# Patient Record
Sex: Male | Born: 1935 | Race: White | Hispanic: No | Marital: Married | Smoking: Former smoker
Health system: Southern US, Community
[De-identification: ages and names within clinical notes are randomized; demographics above are authoritative.]

## PROBLEM LIST (undated history)

## (undated) DIAGNOSIS — M1990 Unspecified osteoarthritis, unspecified site: Secondary | ICD-10-CM

## (undated) DIAGNOSIS — N400 Enlarged prostate without lower urinary tract symptoms: Secondary | ICD-10-CM

## (undated) DIAGNOSIS — K219 Gastro-esophageal reflux disease without esophagitis: Secondary | ICD-10-CM

## (undated) DIAGNOSIS — C679 Malignant neoplasm of bladder, unspecified: Secondary | ICD-10-CM

## (undated) DIAGNOSIS — G4730 Sleep apnea, unspecified: Secondary | ICD-10-CM

## (undated) DIAGNOSIS — Z86718 Personal history of other venous thrombosis and embolism: Secondary | ICD-10-CM

## (undated) DIAGNOSIS — F419 Anxiety disorder, unspecified: Secondary | ICD-10-CM

## (undated) HISTORY — DX: Malignant neoplasm of bladder, unspecified: C67.9

## (undated) HISTORY — DX: Personal history of other venous thrombosis and embolism: Z86.718

---

## 2002-06-22 HISTORY — PX: PROSTATE SURGERY: SHX751

## 2003-04-27 ENCOUNTER — Observation Stay (HOSPITAL_COMMUNITY): Admission: RE | Admit: 2003-04-27 | Discharge: 2003-04-28 | Payer: Self-pay | Admitting: Urology

## 2005-08-27 ENCOUNTER — Ambulatory Visit (HOSPITAL_COMMUNITY): Admission: RE | Admit: 2005-08-27 | Discharge: 2005-08-27 | Payer: Self-pay | Admitting: Family Medicine

## 2005-09-28 ENCOUNTER — Encounter (INDEPENDENT_AMBULATORY_CARE_PROVIDER_SITE_OTHER): Payer: Self-pay | Admitting: *Deleted

## 2005-09-28 ENCOUNTER — Ambulatory Visit: Payer: Self-pay | Admitting: Internal Medicine

## 2005-09-28 ENCOUNTER — Ambulatory Visit (HOSPITAL_COMMUNITY): Admission: RE | Admit: 2005-09-28 | Discharge: 2005-09-28 | Payer: Self-pay | Admitting: Internal Medicine

## 2006-06-22 DIAGNOSIS — C679 Malignant neoplasm of bladder, unspecified: Secondary | ICD-10-CM

## 2006-06-22 DIAGNOSIS — Z86718 Personal history of other venous thrombosis and embolism: Secondary | ICD-10-CM

## 2006-06-22 HISTORY — DX: Malignant neoplasm of bladder, unspecified: C67.9

## 2006-06-22 HISTORY — PX: BLADDER SURGERY: SHX569

## 2006-06-22 HISTORY — DX: Personal history of other venous thrombosis and embolism: Z86.718

## 2006-10-21 ENCOUNTER — Ambulatory Visit (HOSPITAL_COMMUNITY): Admission: RE | Admit: 2006-10-21 | Discharge: 2006-10-21 | Payer: Self-pay | Admitting: Urology

## 2006-10-26 ENCOUNTER — Ambulatory Visit (HOSPITAL_COMMUNITY): Admission: RE | Admit: 2006-10-26 | Discharge: 2006-10-26 | Payer: Self-pay | Admitting: Urology

## 2006-11-05 ENCOUNTER — Encounter (INDEPENDENT_AMBULATORY_CARE_PROVIDER_SITE_OTHER): Payer: Self-pay | Admitting: Urology

## 2006-11-05 ENCOUNTER — Ambulatory Visit (HOSPITAL_COMMUNITY): Admission: RE | Admit: 2006-11-05 | Discharge: 2006-11-05 | Payer: Self-pay | Admitting: Urology

## 2006-11-17 ENCOUNTER — Encounter (INDEPENDENT_AMBULATORY_CARE_PROVIDER_SITE_OTHER): Payer: Self-pay | Admitting: Urology

## 2006-11-17 ENCOUNTER — Inpatient Hospital Stay (HOSPITAL_COMMUNITY): Admission: RE | Admit: 2006-11-17 | Discharge: 2006-11-20 | Payer: Self-pay | Admitting: Urology

## 2007-02-23 ENCOUNTER — Inpatient Hospital Stay (HOSPITAL_COMMUNITY): Admission: EM | Admit: 2007-02-23 | Discharge: 2007-03-01 | Payer: Self-pay | Admitting: Emergency Medicine

## 2007-02-23 ENCOUNTER — Ambulatory Visit: Payer: Self-pay | Admitting: Internal Medicine

## 2007-02-23 ENCOUNTER — Ambulatory Visit (HOSPITAL_COMMUNITY): Admission: RE | Admit: 2007-02-23 | Discharge: 2007-02-23 | Payer: Self-pay | Admitting: Family Medicine

## 2007-04-18 ENCOUNTER — Inpatient Hospital Stay (HOSPITAL_COMMUNITY): Admission: AD | Admit: 2007-04-18 | Discharge: 2007-04-20 | Payer: Self-pay | Admitting: Urology

## 2007-04-19 ENCOUNTER — Encounter (INDEPENDENT_AMBULATORY_CARE_PROVIDER_SITE_OTHER): Payer: Self-pay | Admitting: Urology

## 2007-12-13 ENCOUNTER — Ambulatory Visit (HOSPITAL_COMMUNITY): Admission: RE | Admit: 2007-12-13 | Discharge: 2007-12-13 | Payer: Self-pay | Admitting: Urology

## 2007-12-13 ENCOUNTER — Encounter (INDEPENDENT_AMBULATORY_CARE_PROVIDER_SITE_OTHER): Payer: Self-pay | Admitting: Urology

## 2010-05-06 ENCOUNTER — Ambulatory Visit (HOSPITAL_COMMUNITY): Admission: RE | Admit: 2010-05-06 | Discharge: 2010-05-06 | Payer: Self-pay | Admitting: Urology

## 2010-07-14 ENCOUNTER — Encounter: Payer: Self-pay | Admitting: Urology

## 2010-11-04 NOTE — Consult Note (Signed)
NAME:  [name], [name] NO.:  [id number]   MEDICAL RECORD NO.:  [id number]         PATIENT TYPE:  PAMB   LOCATION:  DAY                           FACILITY:  [facility]   PHYSICIAN:  Ky Barban, M.D.DATE OF BIRTH:  Jan 17, 1927   DATE OF CONSULTATION:  12/13/2007  DATE OF DISCHARGE:                                 CONSULTATION   CHIEF COMPLAINT:  Recurrent bladder tumor.  A 77 year old gentleman  known to have bladder carcinoma, high grade.  Last year, he underwent  partial cystectomy.  Follow-up cystoscopies have been negative.  Recently, he had a cystoscopy.  There is a small papillary growth  recurrence.  It looks like it is a superficial tumor.  He is coming in  as outpatient.  Will have a biopsy and fulguration of this area.  No  history of fever, chills or gross hematuria.   PAST HISTORY:  1. 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  2. In the year 2000 underwent holmium laser ablation of the prostate      for BPH and also history of passing kidney stones about 25 years      ago.  No history of diabetes or hypertension.   FAMILY HISTORY:  History of kidney stones in the family.  No history of  prostate cancer.   PERSONAL HISTORY:  Does not smoke or drink.   REVIEW OF SYSTEMS:  Denies any chest pain, orthopnea, PND, nausea,  vomiting, fever, chills.   On examination, blood pressure 140/80, temperature is normal.  CENTRAL NERVOUS SYSTEM:  No gross neurological deficit.  HEENT:  Negative.  CHEST:  Symmetrical.  HEART:  Regular sinus rhythm, no murmur.  ABDOMEN:  Soft, flat.  Liver, spleen, kidneys are not palpable.  No CVA  tenderness.  EXTERNAL GENITALIA:  Circumcised, meatus adequate.  Testicles are  normal.  RECTAL EXAM:  Was deferred.  EXTREMITIES:  Were normal.   IMPRESSION:  Recurrent bladder tumor.   PLAN:  Cysto fulguration and resection of bladder tumor under  anesthesia  as outpatient.     Ky Barban, M.D.  Electronically Signed    MIJ/MEDQ  D:  12/12/2007  T:  12/12/2007  Job:  [id number]

## 2010-11-04 NOTE — Op Note (Signed)
NAME:  KISHAWN, PICKAR NO.:  [id number]   MEDICAL RECORD NO.:  [id number]          PATIENT TYPE:  INP   LOCATION:  A311                          FACILITY:  [facility]   PHYSICIAN:  Ky Barban, M.D.DATE OF BIRTH:  1926-10-11   DATE OF PROCEDURE:  04/19/2007  DATE OF DISCHARGE:                               OPERATIVE REPORT   PREOPERATIVE DIAGNOSIS:  Bladder tumor.   POSTOPERATIVE DIAGNOSIS:  Bladder tumor.   PROCEDURE:  Transurethral resection of bladder tumor.   ANESTHESIA:  Spinal anesthesia.   SURGEON:  Ky Barban, M.D.   DESCRIPTION OF PROCEDURE:  Under spinal anesthesia in the lithotomy  position, he was prepped and draped.  A #28 Iglesias resectoscope was  introduced into the bladder.  The tumor was located in the junction of  the right bladder wall and the anterior bladder wall.  It looks like  superficial papillary growth.  With the resectoscope I simply resected  the tumor and then using a holmium laser at the following settings,  using pulse energy 1.2 joules, 20 pulses, power 24 watts, total  kilojoules used 5.77, and the areas surrounding the tumor resected area  was treated with laser also at the base.  Grossly I do not see any more  tumor.  There is no bleeding.  The scope is removed and left in a Foley  catheter for drainage.   The patient left the operating room in satisfactory condition.      Ky Barban, M.D.  Electronically Signed     MIJ/MEDQ  D:  04/19/2007  T:  04/19/2007  Job:  [id number]

## 2010-11-04 NOTE — Op Note (Signed)
NAME:  [name], [name] NO.:  [id number]   MEDICAL RECORD NO.:  [id number]          PATIENT TYPE:  AMB   LOCATION:  DAY                            FACILITY:   PHYSICIAN:  Ky Barban, M.D.DATE OF BIRTH:  1927/01/16   DATE OF PROCEDURE:  11/05/2006  DATE OF DISCHARGE:                               OPERATIVE REPORT   PREOPERATIVE DIAGNOSIS:  Gross hematuria.   POSTOPERATIVE DIAGNOSIS:  Bladder tumor.   PROCEDURE:  1. Cystoscopy.  2. Bladder tumor biopsy.  3. Multiple bladder biopsies.  4. Fulguration of the bleeders.   SURGEON:  Ky Barban, MD.   ANESTHESIA:  General.   DESCRIPTION OF PROCEDURE:  The patient under spinal anesthesia after a  general prep and drape, a #25 cystoscope introduced into the bladder,  and with the help of the right angle lens I can see there is a solid,  ulcerated tumor located on the bladder wall at the junction of the left  bladder wall and the dome of the bladder wall.  The rest of the bladder  grossly looks normal.  Then using a rigid biopsy forceps then a flexible  biopsy forceps, some of the biopsies from the tumor are taken, and the  site is fulgurated with a [employer] electrode.  Next, I took some  biopsies at random from the right and left bladder wall, also behind the  trigone and the posterior bladder wall.  These biopsy sites are  fulgurated.  At the end, there is no bleeding. The cystoscope was then  removed, and the patient left the operating room in satisfactory  condition.      Ky Barban, M.D.  Electronically Signed     MIJ/MEDQ  D:  11/05/2006  T:  11/05/2006  Job:  [id number]

## 2010-11-04 NOTE — Discharge Summary (Signed)
NAME:  [name], [name] NO.:  [id number]   MEDICAL RECORD NO.:  [id number]          PATIENT TYPE:  INP   LOCATION:  A328                          FACILITY:  [facility]   PHYSICIAN:  Dorris Singh, DO    DATE OF BIRTH:  January 10, 1927   DATE OF ADMISSION:  02/23/2007  DATE OF DISCHARGE:  09/09/2008LH                               DISCHARGE SUMMARY   ADMISSION DIAGNOSES:  1. Deep vein thrombosis.  2. History of bladder cancer.  3. Benign prostatic hypertrophy.   DISCHARGE DIAGNOSES:  1. Deep vein thrombosis.  2. Leukocytosis.  3. History of bladder cancer.  4. Benign prostatic hypertrophy.   PRIMARY CARE PHYSICIAN:  Dr. Regino Schultze.   CONSULTS:  Dr. Jerre Simon, Physical Therapy.   Tests that were done on the patient while he was here include a venous  ultrasound of his lower extremity which demonstrated acute deep venous  thrombosis in the distal left superficial femoral vein.  A chest x-ray  that was done on September 3rd, CT angiogram showed negative for  negative for pulmonary embolus or acute pulmonary disease, emphysema,  and a remote T12 compression fracture.  And a chest x-ray on September  8th demonstrated no acute abnormalities, changes of COPD and acute  bronchitis.  The patient also had an echocardiogram done with an  ejection fraction of 55.  No pericardial effusion seen.  Normal LVEF and  no wall motion abnormalities.  RV systolic function is also normal.  His  H&P, please refer to that.   HOSPITAL COURSE:  The patient was admitted to the service of [facility]  and was started on Lovenox and Coumadin.  At this point in time, while  he was here, he was started on Lovenox b.i.d. and started on Coumadin  and seen by Physical Therapy to help improve his unstable gait as the  patient said he was weak.  On September 5th, his INR was still not in  range.  He was still continued on the Lovenox and Coumadin then, on  September 7th, he had critical values of an INR of  5.2 and all of his  medications were held.  Pharmacy to dose Coumadin on September 8th and  his INR was 4.4 and his Coumadin, at that point in time, was held.  His  INR became supratherapeutic.  Also the patient started to have a  leukocytosis that was more elevated.  He was currently on antibiotics  and his cultures were negative.  We continued to have him work with  Physical Therapy and got a chest x-ray on September 8th.  Also, the  patient wanted to be seen by Dr. Jerre Simon since he has a history of  bladder cancer and had an appointment with him.  Dr. Jerre Simon came and saw  him.  Once the patient's INR is therapeutic, will think about doing a  cystoscopy.  On September 9th, the patient wanted to go home.  Discussed  with Case Management if that was possible based on his current INR.  Home health nurse could come and check his INRs daily and hold his  Coumadin or  start his Coumadin based on his PCP's recommendations and go  ahead and have PT come see patient.  Also will have patient schedule  cystoscopy with Dr. Jerre Simon on an outpatient basis at this point in time.   DISPOSITION:  Is stable.   SPECIAL INSTRUCTIONS:  The patient will be discharged with home health  care with a nurse to check his INR daily as well as Physical Therapy to  come see him and work with strengthening his lower extremities.  Also,  the patient is to see Dr. Regino Schultze in 1-7 days for follow-up   MEDICATIONS:  That patient will be sent home on include:  1. Flomax 0.4 mg.  2. Fexofenadine 180 mg.  3. MiraLax.  4. Tylenol.  5. Xanax.  6. And also Levaquin 750 mg p.o. daily times 10 days.      Dorris Singh, DO  Electronically Signed     CB/MEDQ  D:  03/01/2007  T:  03/02/2007  Job:  [id number]   cc:   Kirk Ruths, M.D.  Fax: [phone]

## 2010-11-04 NOTE — Op Note (Signed)
NAME:  SAADIQ, POCHE NO.:  [id number]   MEDICAL RECORD NO.:  [id number]          PATIENT TYPE:  INP   LOCATION:  A227                          FACILITY:  [facility]   PHYSICIAN:  Ky Barban, M.D.DATE OF BIRTH:  10-26-1926   DATE OF PROCEDURE:  11/17/2006  DATE OF DISCHARGE:                               OPERATIVE REPORT   PREOPERATIVE DIAGNOSIS:  Bladder tumor, high grade infiltrating.   POSTOPERATIVE DIAGNOSIS:  Bladder tumor, high grade infiltrating.   PROCEDURE:  Partial cystectomy and left pelvic node dissection which  includes obturator and external iliacs.   ANESTHESIA:  General.   ESTIMATED BLOOD LOSS:  100 mL.   SURGEON:  Ky Barban, M.D.   COMPLICATIONS:  None.   COUNTS:  Instrument, needle, and sponge counts were correct.   DESCRIPTION OF PROCEDURE:  The patient is given general endotracheal  anesthesia, placed in supine position, sterile prep and drape.  A #20  Foley catheter was inserted into the bladder and suprapubic midline  incision about 2 1/2 inches long made and carried down through  subcutaneous tissue.  The rectus sheath was incised in the line of the  incision and rectus separated in the midline and retropubic space was  exposed.  The left side of the bladder was dissected away from the  pelvic wall exposing the left common iliac vessels.  A self-retaining  retractor was applied and I proceeded to the node dissection first.  I  palpated the bladder, the bladder tumor was palpated near the dome on  the left bladder wall very easily. It is the size of a large walnut.  I  proceeded to do the node dissection.  The fascia around the left  external iliac vein is opened.  The fat covering this was separated from  the psoas fascia, clipping the lymphatics, going towards the bifurcation  of the common iliac vein.  The obturator nerve and vessels were exposed  and they were identified.  I did not skeletonize those vessels.   There  was an area of fatty tissue in this triangle inbetween the bifurcation  of the left common iliac vein.  They were simply doubly clipped and  divided and the specimen was collected as obturator and left external  iliac artery lymph nodes.  This area was left packed and I proceeded to  do the partial cystectomy.   The bladder was filled up through the Foley catheter. 2-0 Vicryl, two  stitches, were placed in the anterior surface of the bladder and the  anterior surface of the bladder was cleaned.  The peritoneum was pushed  superiorly and a stab wound is made between these two hemostatic  stitches.  The bladder was opened and once the bladder was opened, the  tumor can be easily seen in the left bladder wall at the junction with  the dome. So I, under direct vision, proceeded to circumscribe the tumor  leaving a good margin of healthy bladder around it.  During this  procedure, the urachus was also divided and ligated.  The peritoneum was  opened up near the urachus  and, under direct vision with the pelvic  peritoneum covering that part of the bladder which has tumor and also  the pelvic fat which is covering that part of the bladder, was encircled  and with the help of the artery and applying Allis clamps to the bladder  margins, the entire tumor was nicely removed with good clean surgical  margins around it.   The peritoneum was closed with 3-0 Vicryl continuous stitch. The bladder  was closed in three layers, the first layer was a continuous stitch of 3-  0 Vicryl closing the mucosa.  A second layer was a continuous layer  closing the bladder muscle and the third layer was interrupted sutures  of 3-0 Vicryl placed to cover the suture line.  The bladder was filled  up.  There was no leak.  The suture line of the bladder was drained with  Jackson-Pratt drain which came out through a separate stab wound on the  left side and it was stabilized to the skin with a 0 silk stitch. The   operative site was irrigated with normal saline and I proceeded to close  the incision.  The rectus fascia was closed with continuous stitch of 0  Vicryl.  The skin was closed with staples.  A sterile gauze dressing was  applied.  The patient lost about 100 mL of blood and there were no  complications.  All needle, sponge and instrument count was correct.  The patient left the operating room in satisfactory condition.      Ky Barban, M.D.  Electronically Signed     MIJ/MEDQ  D:  11/17/2006  T:  11/17/2006  Job:  [id number]

## 2010-11-04 NOTE — Group Therapy Note (Signed)
NAME:  [name], KOBASHIGAWA NO.:  [id number]   MEDICAL RECORD NO.:  [id number]          PATIENT TYPE:  INP   LOCATION:  A328                          FACILITY:  [facility]   PHYSICIAN:  Dorris Singh, DO    DATE OF BIRTH:  11/19/1926   DATE OF PROCEDURE:  02/28/2007  DATE OF DISCHARGE:                                 PROGRESS NOTE   HISTORY OF PRESENT ILLNESS:  Patient seen today with wife asking many  questions regarding care and swelling in legs. Talked to patient  regarding some of the possibilities.  They are concerned currently now  of congestive heart failure after speaking to relatives and friends that  as both of his legs and the pain that he has.  I also explained to them  the course of his DVT and why he has swelling at least in his left leg.  The patient has not had any diagnosis of any heart disease per their  recount of his past medical history.  Also, they were supposed to see  Dr. Jerre Simon for his bladder cancer that he had surgery on in May 2008,  and there is a concern that he would like to see him while he is here.  We will go ahead and consult Dr. Jerre Simon.   PHYSICAL EXAMINATION:  VITAL SIGNS:  Temperature 98.4 , pulse 74,  respirations 20 and blood pressure is 119/66.  GENERAL:  This is a 77 year old Caucasian male who is well-developed,  well-nourished, in no acute distress.  HEART:  Regular rate and rhythm.  No gallops, rubs or murmurs noted.  LUNGS:  Clear to auscultation bilaterally.  No wheezes, rales or  rhonchi.  ABDOMEN:  Soft, nontender, nondistended.  EXTREMITIES:  Bilateral lower extremities have +2 pitting edema.  Per  the patient, they are swollen.   LABORATORY DATA:  His labs are as follows today.  His white count is  18.0, hemoglobin is 12.6, hematocrit of 30.0 and platelet count is 381.  His percentage neutrophils is 83%.  Chemistry:  Sodium is 136, potassium  4.0, chloride 102, CO2 is 26, glucose is 103 BUN is 15 and creatinine is  0.75.  Blood cultures have not come back yet, and his urine is negative.  INR is 4.4 today.   ASSESSMENT AND PLAN:  1. Left lower extremity deep vein thrombosis.  The patient is still      currently on Coumadin.  His INR is still elevated.  Also, history      of bladder cancer.  He is being followed as an outpatient.  The      patient would like to see Dr. Jerre Simon.  We will go ahead and consult      him.  2. Leukocytosis.  The patient is still on antibiotics.  Their  are no      blood cultures that have come in yet.  Will Need to get a chest x-      ray today.  Will do that.  Also, due to the patient's leg      ambulation, will have physical therapy come see him,  and have      written for the case management to come see him as well.  I will      also order an incentive spirometry and a 2-D echo to rule out a      chest x-ray to rule out any CHF.     Dorris Singh, DO  Electronically Signed    CB/MEDQ  D:  02/28/2007  T:  02/28/2007  Job:  [id number]

## 2010-11-04 NOTE — Consult Note (Signed)
NAME:  [name], [name] NO.:  [id number]   MEDICAL RECORD NO.:  [id number]          PATIENT TYPE:  INP   LOCATION:  A311                          FACILITY:  [facility]   PHYSICIAN:  Marcello Moores, MD   DATE OF BIRTH:  February 04, 1927   DATE OF CONSULTATION:  04/18/2007  DATE OF DISCHARGE:                                 CONSULTATION   PMD:  Dr. Sherilyn Cooter.   UROLOGIST:  Ky Barban, M.D.   REASON FOR CONSULTATION:  For DVT management and clearance for surgery.   HISTORY OF PRESENT ILLNESS:  Mr. Helfand is a 77 year old man with  history of DVT, who was diagnosed in September 2008 and was on Coumadin  for the last one month and 3 weeks, and it was stopped 5 days ago for  the plan of tomorrow's surgery for bladder cancer tumor removal and  called consultation for clearance as well as for management of the DVT  while he was in the hospital.  The patient had no history of DVT before  but last month he presented to his PMD with left leg swelling and  ultrasound Doppler of the lower extremity was done and DVT was found and  he was treated with Lovenox and discharged with Coumadin and now there  is a plan by Dr. Jerre Simon for surgery on his bladder cancer.  He stopped 5  days ago for the surgery and INR today is 1.2.__________ Otherwise, the  patient has no bleeding, no other complaints, and he is urinating well.   REVIEW OF SYSTEMS:  A 10-point review of systems is noncontributory for  this admission.   ALLERGIES:  He is allergic to CEPHALOSPORINS, PENICILLINS and SULFA  DRUGS.   SOCIAL HISTORY:  He lives with his wife at [place] and denies smoking  and any drug or alcohol abuse.   PAST MEDICAL HISTORY:  1. He has bladder CA, status post partial cystectomy in May 2008.  2. He has benign prostatic hyperplasia.  3. DVT on the left lower leg since September 2008.   HOME MEDICATIONS:  1. Flomax 0.4 mg.  2. Xanax p.r.n.  3. He was on Coumadin 5 mg daily until  last Friday, 5 days ago.  4. MiraLax.   PHYSICAL EXAMINATION:  The patient is lying in bed without any distress.  VITAL SIGNS:  Temperature 98, pulse 59, respiratory rate 20, blood  pressure 117/71.  HEENT:  He has pink conjunctivae, nonicteric sclerae.  NECK:  Supple.  CHEST:  Good air entry bilateral.  CARDIOVASCULAR:  S1-S2 regular, no murmur.  ABDOMEN:  Soft and no area of tenderness.  Normoactive bowel sounds.  EXTREMITIES:  No pedal edema.  CNS:  He is alert and well-oriented.   LABS:  White blood cell 5.7, hemoglobin 11, hematocrit 33.8, platelet  count is 279.  PT 15, INR is 1.2.  Sodium is 141, potassium 4.2,  chloride 107, bicarb 27, glucose is 91, BUN 9, creatinine is 0.7.  Urinalysis is negative including for any sign of blood.   ASSESSMENT:  Deep vein thrombosis.  He was on Coumadin for the  last  about 6 weeks and he was stopped for the sake of surgery, and we will  put him on today on heparin drip and will stop it early in the morning  tomorrow to restart it after surgery and then will change it to  Coumadin, to be discharged with Coumadin, and will watch closely for any  sign of bleeding.  This was discussed with the patient as well as with  Dr. Jerre Simon.  Will continue other medications and he will have his  bladder carcinoma removal tomorrow morning by Dr. Jerre Simon.  Otherwise,  the patient is fairly stable, and will follow him with Dr. Jerre Simon.  This  was discussed, the plan, with the patient and he agreed.      Marcello Moores, MD  Electronically Signed     MT/MEDQ  D:  04/18/2007  T:  04/19/2007  Job:  [id number]

## 2010-11-04 NOTE — H&P (Signed)
NAME:  [name], [name] NO.:  [id number]   MEDICAL RECORD NO.:  [id number]          PATIENT TYPE:  AMB   LOCATION:  DAY                           FACILITY:  [facility]   PHYSICIAN:  Ky Barban, M.D.DATE OF BIRTH:  18-Jun-1927   DATE OF ADMISSION:  11/17/2006  DATE OF DISCHARGE:  LH                              HISTORY & PHYSICAL   ADDENDUM TO HISTORY AND PHYSICAL:  This patient is coming tomorrow to  have partial cystectomy done at [facility].  He recently had  surgery, so he already has History and Physical.  There is no change in  his History and Physical.   The tumor of the bladder was resected, and it showed that he has high  grade urothelial carcinoma and multiple biopsies from remaining bladder  that were negative.  I am going to remove the section of the bladder  that contained high-grade urothelial carcinoma.  It does not show that  it is invasion of the muscle, but CT scan and ultrasound does show  actual physical changes in the bladder at that site which makes me feel  like he has invasion.  I have discussed this with the patient.  He  understands and wants me to go ahead and proceed.  I will also do lymph  node dissection on the left side in the pelvis.      Ky Barban, M.D.  Electronically Signed     MIJ/MEDQ  D:  11/16/2006  T:  11/16/2006  Job:  [id number]

## 2010-11-04 NOTE — H&P (Signed)
NAME:  [name], [name] NO.:  [id number]   MEDICAL RECORD NO.:  [id number]          PATIENT TYPE:  AMB   LOCATION:  DAY                           FACILITY:  [facility]   PHYSICIAN:  Ky Barban, M.D.DATE OF BIRTH:  1926/10/29   DATE OF ADMISSION:  DATE OF DISCHARGE:  LH                              HISTORY & PHYSICAL   CHIEF COMPLAINT:  Gross total painless hematuria, possible bladder  tumor.   HISTORY:  A 77 year old gentleman is having gross total painless  hematuria for several days, almost 3 weeks.  He has undergone complete  workup.  CT of the abdomen and pelvis is done.  It should that there is  a questionable bladder tumor consistent with cancer.  His urine  cytologies are negative and pelvic ultrasound also shows there is  suspicion of lesion in the bladder but I have done cystoscopy in the  office.  I could not see any tumor in the bladder, so I am going to  bring him in the hospital as an outpatient and under do anesthesia will  do biopsies and multiple bladder biopsies.  Also, there is a little bit  of fullness on cystoscopy of the left intermural ureter.  I am going to  do a retrograde on that side also.  I explained this to the patient.  He  understands and wanted me to go ahead and schedule.   PAST MEDICAL HISTORY:  1. In 2000 he underwent holmium laser ablation of the prostate for      BPH.  2. He passed a kidney stone 25 years ago.   No history of diabetes or hypertension.   It should be also added that in year 2000 he was diagnosed elevated PSA  and we did a prostate biopsy.  It was negative, but his PSA has risen  above the normal range ever since then.   FAMILY HISTORY:  History of kidney stones.   PERSONAL HISTORY:  Does not smoke or drink.   REVIEW OF SYSTEMS:  Denies any chest pain, orthopnea, PND, nausea,  vomiting.   EXAMINATION:  VITAL SIGNS:  Blood pressure 138/78, temperature is 97.  CENTRAL NERVOUS SYSTEM:  Negative.  HEENT:  Negative.  CHEST:  Symmetrical.  HEART:  Regular sinus rhythm, no murmur.  ABDOMEN:  Soft, flat.  Liver, spleen, kidneys are not palpable.  No  severe tenderness.  GENITOURINARY:  External genitalia is circumcised and testicles are  normal.  RECTAL:  Prostate 1.5+, smooth and firm.   IMPRESSION:  Gross hematuria, possible bladder tumor.   PLAN:  Cystoscopy, multiple bladder biopsies, left retrograde pyelogram  under anesthesia as outpatient.      Ky Barban, M.D.  Electronically Signed     MIJ/MEDQ  D:  11/04/2006  T:  11/04/2006  Job:  [id number]

## 2010-11-04 NOTE — Procedures (Signed)
NAME:  [name], [name] NO.:  [id number]   MEDICAL RECORD NO.:  [id number]          PATIENT TYPE:  INP   LOCATION:  A328                          FACILITY:  [facility]   PHYSICIAN:  Pricilla Riffle, MD, FACCDATE OF BIRTH:  12-22-1926   DATE OF PROCEDURE:  02/28/2007  DATE OF DISCHARGE:                                ECHOCARDIOGRAM   INDICATIONS:  The patient is a 77 year old with a history of swelling  and bladder cancer.  Test to evaluate LV function.   2-D echo with echo Doppler.  Left ventricle was normal in size with an  end-diastolic dimension of 44 mm.  The interventricular septum and  posterior wall are mildly thickened at 14 and 13 mm each.   The left atrium is grossly normal.  The right atrium is grossly normal.  The right ventricle is normal.   The aortic valve is mildly thickened.  There is no stenosis.  No  insufficiency.  The mitral valve is mildly thickened with mild annular  calcification.  There is no insufficiency.  The pulmonic valve is  grossly normal.  The tricuspid valve is normal with no insufficiency.   Overall LV systolic function is normal with an LVEF of approximately  55%.  There are no definite wall motion abnormalities.  RV systolic  function is also grossly normal.   Note, there is mild LV diastolic dysfunction noted.   No pericardial effusion is seen.      Pricilla Riffle, MD, [facility]  Electronically Signed     PVR/MEDQ  D:  02/28/2007  T:  03/01/2007  Job:  [id number]   cc:   Dorris Singh, DO

## 2010-11-04 NOTE — Discharge Summary (Signed)
NAME:  [name], [name] NO.:  [id number]   MEDICAL RECORD NO.:  [id number]          PATIENT TYPE:  INP   LOCATION:  A328                          FACILITY:  [facility]   PHYSICIAN:  Dorris Singh, DO    DATE OF BIRTH:  [birth year]   DATE OF ADMISSION:  02/23/2007  DATE OF DISCHARGE:  09/09/2008LH                               DISCHARGE SUMMARY   ADDENDUM   Coumadin 5 mg, take as directed, only to be taken once primary care  physician gives patient instructions to do so.      Dorris Singh, DO  Electronically Signed     CB/MEDQ  D:  03/01/2007  T:  03/02/2007  Job:  [id number]

## 2010-11-04 NOTE — Group Therapy Note (Signed)
NAME:  [name], [name] NO.:  [id number]   MEDICAL RECORD NO.:  [id number]          PATIENT TYPE:  INP   LOCATION:  A328                          FACILITY:  [facility]   PHYSICIAN:  Skeet Latch, DO    DATE OF BIRTH:  1926/09/26   DATE OF PROCEDURE:  DATE OF DISCHARGE:                                 PROGRESS NOTE   SUBJECTIVE:  The patient still seems to be ambulating.  The patient  still complains of leg weakness and pain upon ambulation.  The patient  has no other chest pain, abdominal or urinary complaints today.  The  patient's wife is present and at his bedside on exam.  The patient has  not had a bowel movement since yesterday.  Overall, patient's condition  shows no significant change.   OBJECTIVE:  VITAL SIGNS:  Temperature is 99.8, pulse 81, respirations  20, blood pressure 126/64.  The patient is sating 92% on room air.  CARDIOVASCULAR:  Regular rate and rhythm.  No murmurs, gallops or rubs.  RESPIRATORY:  Lungs are clear to auscultation.  No rales, rhonchi or  wheezing.  ABDOMEN:  Soft, nontender, nondistended.  Positive bowel sounds.  EXTREMITIES:  Edema is still improving in the left ankle.  No pain on  palpation with no erythema.   LABORATORY DATA:  Blood cultures are still negative at this time.  Urine  culture showed multiple species.  Recollection is indicated at this  time.  PT 50.9, INR 5.2, white count 16.1, hemoglobin 12.6, hematocrit  37.7, platelets 375.   ASSESSMENT AND PLAN:  1. Left lower extremity deep venous thrombosis.  The patient is on      Lovenox and Coumadin.  His INR is elevated.  Pharmacy is following      his levels, and his Coumadin is on hold at this time.  2. The patient has a history of bladder cancer, partial cystectomy.      This is being followed as an outpatient.  3. Leukocytosis.  Still unknown etiology.  Is more elevated than      yesterday.  The patient is on antibiotics.  Blood cultures are      negative so far.   We will get a chest x-ray to rule out any      pulmonary disease at this time.  4. The patient's ambulation and leg pain continues.  We will get      physical therapy to see patient in the morning, and consult case      management regarding possible stent with a short-term rehab      facility versus home physical therapy.  The patient probably still      needs to be sent home on antibiotics, even if his chest x-ray is      negative.      Skeet Latch, DO  Electronically Signed     SM/MEDQ  D:  02/27/2007  T:  02/28/2007  Job:  [id number]

## 2010-11-04 NOTE — H&P (Signed)
NAME:  Nicholas, Holder NO.:  [id number]   MEDICAL RECORD NO.:  [id number]          PATIENT TYPE:  INP   LOCATION:  A311                          FACILITY:  [facility]   PHYSICIAN:  Ky Barban, M.D.DATE OF BIRTH:  [birth year]   DATE OF ADMISSION:  04/18/2007  DATE OF DISCHARGE:  LH                              HISTORY & PHYSICAL   CHIEF COMPLAINT:  Recurrent bladder tumor.   HISTORY:  A 77 year old gentleman who 6 months ago underwent partial  cystectomy for carcinoma of the bladder.  He had a high-grade tumor and  before doing partial cystectomy I had done multiple bladder biopsies.  His bladder was negative for any other tumor site and recently a routine  cystoscopy was done.  He was found to have a recurrent tumor on the  right bladder wall.  He had the original tumor on the left side but  there was no recurrence in that area, so I have advised him to undergo  TUR bladder tumor, for which he is being admitted in the hospital.  The  reason we had to admit him is because he is on Coumadin.  Since his  surgery he has developed DVT and has been on Coumadin and after  discussing with medical consult, I decided to bring him in.  We have  discontinued his Coumadin 3 days ago.  Today we are rechecking his pro  time.  Preoperatively we are going to put him on a heparin drip so that  we can do his surgery tomorrow.  The patient is well aware of that.   PAST MEDICAL HISTORY:  1. He had undergone holmium laser ablation of prostate for BPH in      2000.  2. He passed a kidney stone 25 years ago.  3. Partial cystectomy as I mentioned above for bladder cancer.   No history of diabetes or hypertension.   FAMILY HISTORY:  History of kidney stones in the family.   PERSONAL HISTORY:  Does not smoke or drink.   REVIEW OF SYSTEMS:  No orthopnea, PND, nausea, vomiting, fever, chills  or any voiding difficulty.  No chest pain, shortness of breath.   On examination,  moderately-built.  Blood pressure 140/80, temperature is normal.  CENTRAL NERVOUS SYSTEM:  No gross neurological deficit.  HEAD, NECK, EYE, ENT:  Negative.  CHEST:  Symmetrical.  HEART:  Regular sinus rhythm.  No murmur.  ABDOMEN:  Soft, flat.  Liver, spleen, kidneys are not felt.  There was  no CVA tenderness.  EXTERNAL GENITALIA:  Unremarkable.  RECTAL:  Deferred.  EXTREMITIES:  Normal.   IMPRESSION:  1. Recurrent bladder tumor.  2. Deep vein thrombosis and is on Coumadin.   PLAN:  Coumadin has been discontinued.  Today I will be checking his pro  time.  With consultation with the hospitalist, he will be placed on  heparin drip and be discontinued before his surgery.  Then I will  proceed with the TUR bladder tumor.  I am going to do a biopsy of this  tumor, resect it carefully, and probably use the holmium laser  to  cauterize the tumor.      Ky Barban, M.D.  Electronically Signed     MIJ/MEDQ  D:  04/18/2007  T:  04/19/2007  Job:  [id number]   cc:   Kirk Ruths, M.D.  Fax: [phone]

## 2010-11-04 NOTE — Group Therapy Note (Signed)
NAME:  [name], [name] NO.:  [id number]   MEDICAL RECORD NO.:  [id number]          PATIENT TYPE:  INP   LOCATION:  A328                          FACILITY:  [facility]   PHYSICIAN:  Skeet Latch, DO    DATE OF BIRTH:  [birth year]   DATE OF PROCEDURE:  02/26/2007  DATE OF DISCHARGE:                                 PROGRESS NOTE   The patient seems to be ambulating more today.  The wife is present  during exam and states that the patient went to the restroom and, after  he got out of the restroom, his legs felt weak, and the patient could  not get back to his bed.  The patient still has decreased ankle swelling  and pain in his lower left extremity.  The patient is now complaining of  some almost hip-type discomfort with movement of his legs.  The patient  is starting to have bowel movements pretty regularly also.  Overall, the  patient is stable and will continue to encourage ambulation.   OBJECTIVE:  VITAL SIGNS:  Temperature is 98.9, pulse 81, respirations  22, blood pressure 117/70.  The patient is satting 93% on room air.  CARDIOVASCULAR:  Regular rate and rhythm without murmurs, gallops, or  rubs.  RESPIRATORY:  Clear to auscultation bilaterally.  No rhonchi, rales, or  wheezing.  ABDOMEN:  Soft and nontender, nondistended.  Positive bowel sounds.  EXTREMITIES:  Still has some slight left ankle edema, minimal pain.  No  erythema to his calf or his ankle area.   LABS:  PT today is 46.4, INR 4.6.  White count is 13.6, hemoglobin 12.6,  hematocrit 37.8, platelet count is 401,000.   ASSESSMENT AND PLAN:  1. Left lower extremity deep vein thrombosis.  The patient continues      to be on Lovenox and Coumadin.  Will hold until next dose.  Will      have pharmacy begin doses of Coumadin.  2. History of bladder cancer and partial cystectomy.  This will be      followed as an outpatient.  3. Leukocytosis, unknown etiology.  Seems to be improving, but      continues  p.o. antibiotics.  So far, his blood cultures are      negative.  We will recheck white count in the a.m.  4. I anticipate the patient being discharged in the next 1 to 2 days      if the patient's ambulation improves.  Physical therapy was working      with the patient.  They will not be available until Monday morning.      I spoke with nursing and they will encourage his ambulation, and      help the patient ambulate.  If the patient continues to have      stagnant ambulation, the patient may need home physical therapy or      a short stay in short-term rehab facility.      Skeet Latch, DO  Electronically Signed     SM/MEDQ  D:  02/26/2007  T:  02/26/2007  Job:  [id number] 

## 2010-11-04 NOTE — Op Note (Signed)
NAME:  HAL, NORRINGTON NO.:  [id number]   MEDICAL RECORD NO.:  [id number]          PATIENT TYPE:  AMB   LOCATION:  DAY                           FACILITY:  [facility]   PHYSICIAN:  Ky Barban, M.D.DATE OF BIRTH:  1926-09-13   DATE OF PROCEDURE:  DATE OF DISCHARGE:                               OPERATIVE REPORT   PREOP DIAGNOSIS:  Bladder tumor.   POSTOP DIAGNOSIS:  Bladder tumor.   PROCEDURE:  Bladder tumor biopsy and fulguration.   ANESTHESIA:  General.   PROCEDURE:  The patient was given general endotracheal anesthesia,  placed in lithotomy position, area prepped and draped.  A #25 cystoscope  introduced into the bladder.  The tumor was located behind the bladder  neck slightly to the right of the midline on the anterior bladder wall.  Tumor was brought in focus and the flexible forceps biopsy of the tumor  was done.  Then using the [facility] resectoscope and I used a roller  electrode, the tumor was completely fulgurated.  The rest of the bladder  looks grossly normal.  The resectoscope was removed.  A #20 Foley  catheter left in for drainage.  The patient left the operating room in  satisfactory condition.      Ky Barban, M.D.  Electronically Signed     MIJ/MEDQ  D:  12/13/2007  T:  12/14/2007  Job:  [id number]

## 2010-11-04 NOTE — H&P (Signed)
NAME:  [name], BANKO NO.:  [id number]   MEDICAL RECORD NO.:  [id number]          PATIENT TYPE:  INP   LOCATION:  A328                          FACILITY:  [facility]   PHYSICIAN:  Marcello Moores, MD   DATE OF BIRTH:  [birth year]   DATE OF ADMISSION:  02/23/2007  DATE OF DISCHARGE:  LH                              HISTORY & PHYSICAL   PRIMARY MEDICAL DOCTOR:  [name], M.D.   CHIEF COMPLAINT:  Sent by PMD for DVT.   HISTORY OF PRESENT ILLNESS:  He is 77 year old male patient with history  of bladder cancer, status post surgery, and BPH.  He was sent by his PMD  after Doppler proved DVT.  The patient has left lower leg swelling for  the last several days, and he presented to his PMD, and Doppler  ultrasound of the lower legs was done and showed DVT, and he was sent to  the emergency room for admission.  The Doppler result was acute deep  venous thrombosis in the distal left superficial femoral vein.  As per  the patient and his wife, the leg swelling was there for the last 1  week, and with mild aching pain.  The patient had history of bladder  cancer, and partial cystectomy we was done in May 2008, months ago.  Otherwise, other than this leg swelling, the patient has no other  complaints.  No shortness of breath.  No chest pain.  No cough.  He has  no dizziness, and he has not any fever.  He has no urinary complaints.  No abdominal complaints currently.   REVIEW OF SYSTEMS:  Ten-point review of system is as mentioned in the  HPI, otherwise noncontributory.   ALLERGIES:  1. CEPHALOSPORINS.  2. PENICILLINS.  3. SULFA DRUGS.   SOCIAL HISTORY:  The patient lives with his wife in [place], and he  denied any smoking, denied any alcohol abuse.   PAST MEDICAL HISTORY:  1. Bladder CA, status post partial cystectomy in May 2008.  2. Benign prostatic hyperplasia.   HOME MEDICATIONS:  1. Flomax 0.4 mg once a day.  2. Xanax p.r.n.  3. MiraLax p.r.n.   PHYSICAL EXAMINATION:  GENERAL:  The patient is lying in the bed,  without any cardiopulmonary distress.  VITAL SIGNS: Blood pressure is 131/90, and temperature is 97, and pulse  rate is 89, respiratory rate 20, and saturation 98% on room air.  HEENT:  He has pink conjunctivae.  Anicteric sclerae.  NECK:  Supple.  CHEST:  He has good air entry bilaterally.  CVS:  S1, S2 well heard.  No murmur.  ABDOMEN:  Soft.  There is a surgical scar on the lower part of the  abdomen.  Normoactive bowel sounds.  EXTREMITIES:  The left lower leg is swollen when compared with the right  one, slightly warm, but there is not any color change, slightly tender.  CNS:  He is alert and well oriented.   LABORATORIES:  White blood cells 14.9, hemoglobin is 13, and hematocrit  is 39, platelet count is 376. PT/INR is 15/1, and PTT  is 43.  On the  chemistry, sodium is 140, and potassium is 4, chloride is 107,  bicarbonate 27, glucose is 124, BUN is 14, creatinine 0.4, calcium is  8.6.  Venous Doppler of the lower legs which was done today showed acute  deep venous thrombosis in the distal left superficial femur pain.   ASSESSMENT:  1. Deep venous thrombosis.  We will admit him until PT/INR is in the      therapeutic level, and we will put him on Lovenox b.i.d. per kg,      and will start also Coumadin to bridge it, and the patient can be      discharged when INR is in the therapeutic level.  We will do chest      CT scan to rule out any possibility of pulmonary embolism before we      discharge him as well.  2. History of bladder cancer, status post resection.  3. Benign prostatic hypertrophy.      Marcello Moores, MD  Electronically Signed     MT/MEDQ  D:  02/23/2007  T:  02/24/2007  Job:  [id number]   cc:   [name], M.D.  Fax: [phone]

## 2010-11-07 NOTE — Op Note (Signed)
   NAME:  [name], [name] NO.:  [id number]   MEDICAL RECORD NO.:  [id number]                   PATIENT TYPE:  AMB   LOCATION:  DAY                                  FACILITY:  [facility]   PHYSICIAN:  Ky Barban, M.D.            DATE OF BIRTH:  [birth year]   DATE OF PROCEDURE:  DATE OF DISCHARGE:                                 OPERATIVE REPORT   PREOPERATIVE DIAGNOSIS:  Benign prostatic hypertrophy.   POSTOPERATIVE DIAGNOSIS:  Benign prostatic hypertrophy.   PROCEDURE:  Holmium laser ablation of the prostate.   ANESTHESIA:  Spinal.   DESCRIPTION OF PROCEDURE:  The patient was given spinal anesthesia and  placed in the lithotomy position.  A #26 resectoscope was introduced into  the bladder, and the bladder was inspected.  The prostate and urethra were  inspected.  The laser bridge was introduced along with side-firing fiber.  The ablation of the prostate was started from the bladder neck, first at the  6 o'clock position.  A small median lobe was ablated to the level of the  verumontanum, and then the bladder neck was circumferentially ablated.  The  resectoscope was rotated to the right side, and ablation of the right lobe  was done between the 11 and 7 o'clock positions, ending at the level of the  verumontanum.  Similarly, the left lobe was ablated between the 1 and 5  o'clock positions.  It was ablated to the level of the verumontanum.  There  was small tissue anteriorly which was ablated.  Now I could see that there  was good cavity created inside the prostatic urethra.  The bladder neck was  opened looking from the verumontanum.  There was minimal bleeding after  irrigating the bladder.  The resectoscope was removed.  A 20 Foley catheter  with 5 cc balloon was left in for drainage which was pinkish.   The patient left the operating room in satisfactory condition.      ___________________________________________                  Ky Barban, M.D.   MIJ/MEDQ  D:  04/27/2003  T:  04/27/2003  Job:  [id number]

## 2010-11-07 NOTE — Discharge Summary (Signed)
NAME:  [name], [name] NO.:  [id number]   MEDICAL RECORD NO.:  [id number]          PATIENT TYPE:  INP   LOCATION:  A227                          FACILITY:  [facility]   PHYSICIAN:  Ky Barban, M.D.DATE OF BIRTH:  Oct 30, 1926   DATE OF ADMISSION:  11/17/2006  DATE OF DISCHARGE:  05/31/2008LH                               DISCHARGE SUMMARY   CHIEF COMPLAINT:  Bladder carcinoma.   The patient is a 77 year old gentleman for several weeks having on and  off gross hematuria.  Cystoscopy was done with multiple bladder biopsies  and it was found to have high-grade localized tumor in the anterior  bladder wall, so I advised him to undergo partial cystectomy for which  he is being brought.  He was brought this time after having routine  admission work-up, CBC, urinalysis and __________  7.  EKG and chest x-  ray were normal.  He is otherwise in good health.  He had about 8 years  ago a holmium laser ablation of the prostate and 25 years ago he passed  a kidney stone.   HOSPITAL COURSE:  He was brought in the hospital and on May 29, he  underwent partial cystectomy.  First postop day general status is good.  Abdomen  is soft, nondistended.  There is about 10 mL of drain.  WBC  count is 10.2, hematocrit 39.4, sodium 136, potassium was 4.  Chloride  104, CO2 is 27, BUN is 8, creatinine is 0.83, glucose 124.  It is  satisfactory postop course.  He was started on diet and out of bed.  Second postop day, he is afebrile.  No complaints and the sump is  draining almost nothing, so sump was taken out.  Third postop day of May  31, he is afebrile.  General status is good.  He is up and walking  around and still has Foley catheter, so I am going to discharge him home  with Foley catheter and I will take the staples out in the office and  also his catheter when he comes in the office.  His final pathology  report is back and shows invasive high-grade urothelial carcinoma.  Tumor was  invasive to deep muscularis propria.  Lateral and deep margins  of excision free of tumor.  Left pelvic lymph nodes resection.  Two  lymph nodes identified, negative for metastatic carcinoma.   IMPRESSION:  Bladder carcinoma.  He is TNM staging T2b(p)NO(p)MX.   DISCHARGE MEDICATIONS:  He is advised to continue usual medications and  given  Cipro 150 mg b.i.d. for 5 days and Percocet 7.5 mg q.6h p.r.n.  #30.   FOLLOW UP:  We will see him back in the office in one week.      Ky Barban, M.D.  Electronically Signed     MIJ/MEDQ  D:  01/07/2007  T:  01/08/2007  Job:  [id number]

## 2010-11-07 NOTE — Consult Note (Signed)
NAME:  [name], [name] NO.:  [id number]   MEDICAL RECORD NO.:  [id number]          PATIENT TYPE:  NA   LOCATION:  NA                            FACILITY:  [facility]   PHYSICIAN:  Ky Barban, M.D.DATE OF BIRTH:  Mar 20, 1927   DATE OF CONSULTATION:  DATE OF DISCHARGE:                                 CONSULTATION   __________ is July 23.   FOLLOWUP NOTE:  [name] has recurrent bladder cancer so today we gave  him third BCG treatment.  He will come back in 1 week.      Ky Barban, M.D.  Electronically Signed     MIJ/MEDQ  D:  02/06/2008  T:  02/07/2008  Job:  [id number]

## 2010-11-07 NOTE — Op Note (Signed)
NAME:  [name], [name] NO.:  [id number]   MEDICAL RECORD NO.:  [id number]          PATIENT TYPE:  AMB   LOCATION:  DAY                           FACILITY:  [facility]   PHYSICIAN:  Lionel December, M.D.    DATE OF BIRTH:  April 13, 1927   DATE OF PROCEDURE:  09/28/2005  DATE OF DISCHARGE:                                 OPERATIVE REPORT   PROCEDURE:  Colonoscopy.   INDICATIONS:  [name] is 77 year old Caucasian male who is here for screening  colonoscopy.  Family history is negative for colorectal carcinoma.  Procedure risks were reviewed the patient, informed consent was obtained.   MEDS FOR CONSCIOUS SEDATION:  Demerol 50 mg IV, Versed 2 mg IV.   FINDINGS:  Procedure performed in endoscopy suite.  The patient's vital  signs and O2 sat were monitored during procedure and remained stable.  The  patient was placed in left lateral position and rectal examination  performed.  No abnormality noted on external or digital exam.  Olympus  videoscope was placed in rectum and advanced under vision into sigmoid colon  beyond.  Preparation was excellent.  He had few diverticula at sigmoid colon  and one at transverse colon.  Scope was advanced cecum which was identified  by ileocecal valve and appendiceal orifice.  Pictures taken for the record.  As the scope was withdrawn colonic mucosa was carefully examined.  There was  a 2 mm polyp on a fold at ascending colon which was easily ablated via cold  biopsy.  Mucosa rest of colon was normal.  Rectal mucosa similarly was  normal.  Scope was retroflexed examine anorectal junction which was the  anorectal junction and small hemorrhoids were noted below the dentate line.  Endoscope was straightened and withdrawn.  The patient tolerated the  procedure well.   FINAL DIAGNOSIS:  Tiny polyp ablated by cold biopsy from the ascending  colon.  Few diverticula at sigmoid and one at transverse colon.  Next small  external hemorrhoids.   RECOMMENDATIONS:  Standard instructions given.  High-fiber diet.  I will be  contacting the patient with the biopsy results and further recommendations.      Lionel December, M.D.  Electronically Signed     NR/MEDQ  D:  09/28/2005  T:  09/28/2005  Job:  [id number]   cc:   Angus G. Renard Matter, MD  Fax: [phone]

## 2010-11-07 NOTE — Discharge Summary (Signed)
NAME:  [name], [name] NO.:  [id number]   MEDICAL RECORD NO.:  [id number]          PATIENT TYPE:  INP   LOCATION:  A311                          FACILITY:  [facility]   PHYSICIAN:  Ky Barban, M.D.DATE OF BIRTH:  1927/05/15   DATE OF ADMISSION:  04/18/2007  DATE OF DISCHARGE:  10/29/2008LH                               DISCHARGE SUMMARY   FINAL DISCHARGE DIAGNOSES:  1. Carcinoma of the bladder.  2. Deep vein thrombosis.   [name] is a 77 year old gentleman, six months ago underwent partial  cystectomy for carcinoma of the bladder.  He had a high-grade bladder  tumor, and the cystoscopy shows a questionable area in the bladder and  that is why I wanted to do a biopsy under anesthesia.  He has developed  a DVT and is on Coumadin so I have to bring him back in the hospital the  day before so we can switch him from Coumadin to heparin.  No other  significant medical problems like diabetes or hypertension.  He was  brought in, medical consultation was obtained, and he was started on IV  heparin.  Taken to the operating room, underwent a bladder biopsy and  fulguration of the area.  Postoperatively, he did fine, switched him  back to Coumadin, and he is voiding fine, clear urine, and he was  discharged home.  The final pathology report came back and did not show  any recurrent cancer.  The patient is being discharged and will be  followed by me in the office.   PLAN:  The patient was advised to continue followup with his family  physician for his maintenance therapy with Coumadin.  I will see him  back in the office in two weeks.      Ky Barban, M.D.  Electronically Signed     MIJ/MEDQ  D:  06/01/2007  T:  06/01/2007  Job:  [id number]

## 2010-11-07 NOTE — H&P (Signed)
NAME:  [name], [name] NO.:  [id number]   MEDICAL RECORD NO.:  [id number]                  PATIENT TYPE:   LOCATION:                                       FACILITY:   PHYSICIAN:  Ky Barban, M.D.            DATE OF BIRTH:   DATE OF ADMISSION:  DATE OF DISCHARGE:                                HISTORY & PHYSICAL   CHIEF COMPLAINT:  Symptoms of prostatism.   HISTORY:  This is a 77 year old gentleman being followed by me since 2000.  He was initially referred to me by Patrica Duel, M.D., with a lot of  symptoms of prostatism, gross hematuria.  A workup showed that he has BPH  with bladder neck obstruction.  He has mildly elevated PSA at that time so I  did a prostate biopsy, which was negative.  I recommended and advised that  he can have holmium laser ablation or he can try alpha blockers, and he has  been taking Flomax since and he was doing fairly well until recently he  started to complain that even with the Flomax, he is still having  considerable symptoms of prostatism, and I have advised him that we can go  ahead and do a holmium laser ablation versus a TUR of the prostate, but I  recommended that laser ablation will be fine and he is coming as outpatient  and it will be done and will be kept overnight in the hospital.  I have  discussed the procedure, its limitations and complications, and his PSA has  been becoming normal since then.   PAST MEDICAL HISTORY:  1. Compression fracture of vertebra in 1998.  2. Ankle fracture on the left side while he was in high school.  3. Kidney stones passed 20 years ago.   FAMILY HISTORY:  Kidney stones, no prostate cancer.   SOCIAL HISTORY:  He does not smoke or drink.   REVIEW OF SYSTEMS:  Unremarkable.   PHYSICAL EXAMINATION:  VITAL SIGNS:  Blood pressure is 150/80, temperature  is normal.  CENTRAL NERVOUS SYSTEM:  No gross neurologic deficit.  HEENT, NECK:  Negative.  CHEST:   Symmetrical.  HEART:  Regular sinus rhythm.  ABDOMEN:  Soft, flat.  Liver, spleen, kidneys are not palpable, no CVA  tenderness.  GENITOURINARY:  External genitalia circumcised, meatus adequate, testicles  are normal.  RECTAL:  Normal sphincter tone, no rectal mass.  Prostate 1-1/2+, smooth and  firm.    IMPRESSION:  Benign prostatic hypertrophy.   PLAN:  Holmium laser ablation under anesthesia as outpatient.     ___________________________________________                                         Ky Barban, M.D.   MIJ/MEDQ  D:  04/26/2003  T:  04/26/2003  Job:  [id number]  

## 2011-03-19 LAB — CBC
HCT: 48.6
MCV: 93.4
RBC: 5.2
WBC: 6.5

## 2011-03-19 LAB — DIFFERENTIAL
Eosinophils Absolute: 0.1
Eosinophils Relative: 1
Lymphs Abs: 1.8
Monocytes Relative: 6

## 2011-03-19 LAB — BASIC METABOLIC PANEL
Chloride: 108
GFR calc Af Amer: >60
Potassium: 4.7
Sodium: 140

## 2011-03-19 LAB — URINALYSIS, ROUTINE W REFLEX MICROSCOPIC
Glucose, UA: NEGATIVE
Hgb urine dipstick: NEGATIVE
Ketones, ur: NEGATIVE
Protein, ur: NEGATIVE
pH: 6

## 2011-04-01 LAB — CROSSMATCH
ABO/RH(D): O POS
Antibody Screen: NEGATIVE

## 2011-04-01 LAB — BASIC METABOLIC PANEL
CO2: 29
Chloride: 107
Creatinine, Ser: 0.71
GFR calc Af Amer: >60
Potassium: 4.2

## 2011-04-01 LAB — CBC
HCT: 33.8 — ABNORMAL LOW
MCHC: 33.4
MCV: 84.5
RBC: 4 — ABNORMAL LOW

## 2011-04-01 LAB — DIFFERENTIAL
Basophils Relative: 1
Eosinophils Absolute: 0.1
Eosinophils Relative: 2
Lymphs Abs: 1.5
Monocytes Absolute: 0.4
Monocytes Relative: 7
Neutrophils Relative %: 62

## 2011-04-01 LAB — URINALYSIS, ROUTINE W REFLEX MICROSCOPIC
Bilirubin Urine: NEGATIVE
Glucose, UA: NEGATIVE
Hgb urine dipstick: NEGATIVE
Ketones, ur: NEGATIVE
Protein, ur: NEGATIVE
Urobilinogen, UA: 0.2

## 2011-04-01 LAB — HEPARIN LEVEL (UNFRACTIONATED): Heparin Unfractionated: 0.43

## 2011-04-03 LAB — URINE CULTURE
Colony Count: 25000
Special Requests: NEGATIVE

## 2011-04-03 LAB — CBC
HCT: 37.7 — ABNORMAL LOW
HCT: 39.2
Hemoglobin: 12.6 — ABNORMAL LOW
Hemoglobin: 12.6 — ABNORMAL LOW
Hemoglobin: 13.1
MCHC: 33.4
Platelets: 373
Platelets: 375
Platelets: 376
RBC: 4.15 — ABNORMAL LOW
RBC: 4.21 — ABNORMAL LOW
RBC: 4.21 — ABNORMAL LOW
RDW: 12.9
RDW: 13.1
WBC: 13.6 — ABNORMAL HIGH
WBC: 15.2 — ABNORMAL HIGH
WBC: 16.1 — ABNORMAL HIGH

## 2011-04-03 LAB — URINALYSIS, ROUTINE W REFLEX MICROSCOPIC
Bilirubin Urine: NEGATIVE
Hgb urine dipstick: NEGATIVE
Hgb urine dipstick: NEGATIVE
Nitrite: NEGATIVE
Nitrite: NEGATIVE
Specific Gravity, Urine: 1.025
Specific Gravity, Urine: <1.005 — ABNORMAL LOW
Urobilinogen, UA: 0.2
Urobilinogen, UA: 0.2
pH: 6.5

## 2011-04-03 LAB — DIFFERENTIAL
Basophils Absolute: 0.1
Basophils Relative: 0
Basophils Relative: 0
Eosinophils Absolute: 0.3
Eosinophils Relative: 3
Lymphocytes Relative: 10 — ABNORMAL LOW
Lymphocytes Relative: 8 — ABNORMAL LOW
Lymphocytes Relative: 8 — ABNORMAL LOW
Lymphocytes Relative: 8 — ABNORMAL LOW
Lymphs Abs: 1
Lymphs Abs: 1.1
Lymphs Abs: 1.2
Lymphs Abs: 1.5
Monocytes Absolute: 0.7
Monocytes Absolute: 1.2 — ABNORMAL HIGH
Monocytes Relative: 5
Monocytes Relative: 7
Monocytes Relative: 7
Monocytes Relative: 9
Neutro Abs: 10.9 — ABNORMAL HIGH
Neutro Abs: 12.1 — ABNORMAL HIGH
Neutro Abs: 15 — ABNORMAL HIGH
Neutrophils Relative %: 80 — ABNORMAL HIGH
Neutrophils Relative %: 87 — ABNORMAL HIGH

## 2011-04-03 LAB — BASIC METABOLIC PANEL
BUN: 14
CO2: 27
Calcium: 8 — ABNORMAL LOW
Calcium: 8.6
GFR calc Af Amer: >60
GFR calc non Af Amer: >60
GFR calc non Af Amer: >60
Glucose, Bld: 124 — ABNORMAL HIGH
Potassium: 4.1
Sodium: 136
Sodium: 140

## 2011-04-03 LAB — B-NATRIURETIC PEPTIDE (CONVERTED LAB): Pro B Natriuretic peptide (BNP): <30

## 2011-04-03 LAB — CULTURE, BLOOD (ROUTINE X 2)
Culture: NO GROWTH
Report Status: 9092008
Report Status: 9092008

## 2011-04-03 LAB — PROTIME-INR
INR: 1.1
INR: 2.4 — ABNORMAL HIGH
INR: 4.6 — ABNORMAL HIGH
INR: 5.2
Prothrombin Time: 27.3 — ABNORMAL HIGH
Prothrombin Time: 43 — ABNORMAL HIGH
Prothrombin Time: 50.9 — ABNORMAL HIGH

## 2012-08-30 ENCOUNTER — Encounter (HOSPITAL_COMMUNITY): Payer: Self-pay | Admitting: Pharmacy Technician

## 2012-08-30 NOTE — H&P (Signed)
  NTS SOAP Note  Vital Signs:  Vitals as of: 08/30/2012: Systolic 141: Diastolic 74: Heart Rate 62: Temp 99.47F: Height 57ft 8in: Weight 158Lbs 0 Ounces: Pain Level 3: BMI 24  BMI : 24.02 kg/m2  Subjective: This 79 Years 77 Months old Male presents forsymptoms of    HERNIA: ,Has had a left inguinal hernia for the past few years.  Made worse with straining.  Reduces hernia himself.  Is causing him discomfort.  Review of Symptoms:  Constitutional:  some fatigue    occ headache Eyes:  blurred vision bilateral sinus problems Cardiovascular:  unremarkable   Respiratory:  wheezing Gastrointestin    dyspepsia Genitourinary:    frequency   back pain Skin:unremarkable Hematolgic/Lymphatic:unremarkable     Allergic/Immunologic:unremarkable     Past Medical History:    Reviewed   Past Medical History  Surgical History: prostate/bladder surgery Medical Problems: prostate cancer Allergies: PCN, sulfa Medications: tamsulosin, dulcolas, diphenhydramine   Social History:Reviewed  Social History  Preferred Language: English Race:  White Ethnicity: Not Hispanic / Latino Age: 79 Years 6 Months Marital Status:  M Alcohol:  No Recreational drug(s):  No   Smoking Status: Never smoker reviewed on 08/30/2012 Functional Status reviewed on mm/dd/yyyy ------------------------------------------------ Bathing: Normal Cooking: Normal Dressing: Normal Driving: Normal Eating: Normal Managing Meds: Normal Oral Care: Normal Shopping: Normal Toileting: Normal Transferring: Normal Walking: Normal Cognitive Status reviewed on mm/dd/yyyy ------------------------------------------------ Attention: Normal Decision Making: Normal Language: Normal Memory: Normal Motor: Normal Perception: Normal Problem Solving: Normal Visual and Spatial: Normal   Family History:  Reviewed   Family History              Father:  Cancer             Mother:   Cancer, Coronary Artery Disease    Objective Information: General:  Well appearing, well nourished in no distress. Neck:  Supple without lymphadenopathy.  Heart:  RRR, no murmur Lungs:    CTA bilaterally, no wheezes, rhonchi, rales.  Breathing unlabored. Abdomen:Soft, NT/ND, no HSM, no masses.  Reducible left inguinal hernia, narrow orofice ZO:[id number]    Assessment:Left inguinal hernia  Diagnosis &amp; Procedure:    Plan:Scheduled for left inguinal hernorrhaphy on 09/05/12.   Patient Education:Alternative treatments to surgery were discussed with patient (and family).  Risks and benefits  of procedure were fully explained to the patient (and family) who gave informed consent. Patient/family questions were addressed.  Follow-up:Pending Surgery

## 2012-08-31 NOTE — Patient Instructions (Addendum)
Your procedure is scheduled on: 09/05/2012  Report to [facility] at  730   AM.  Call this number if you have problems the morning of surgery: [phone]   Remember:   Do not drink or eat food:After Midnight.  :  Take these medicines the morning of surgery with A SIP OF WATER: Xanax   Do not wear jewelry, make-up or nail polish.  Do not wear lotions, powders, or perfumes. You may wear deodorant.  Do not shave 48 hours prior to surgery. Men may shave face and neck.  Do not bring valuables to the hospital.  Contacts, dentures or bridgework may not be worn into surgery.  Leave suitcase in the car. After surgery it may be brought to your room.  For patients admitted to the hospital, checkout time is 11:00 AM the day of discharge.   Patients discharged the day of surgery will not be allowed to drive home.    Special Instructions: Shower using CHG 2 nights before surgery and the night before surgery.  If you shower the day of surgery use CHG.  Use special wash - you have one bottle of CHG for all showers.  You should use approximately 1/3 of the bottle for each shower.   Please read over the following fact sheets that you were given: Pain Booklet, MRSA Information, Surgical Site Infection Prevention and Care and Recovery After Surgery  Hernia Repair Care After These instructions give you information on caring for yourself after your procedure. Your doctor may also give you more specific instructions. Call your doctor if you have any problems or questions after your procedure. HOME CARE   You may have changes in your poops (bowel movements).  You may have loose or watery poop (diarrhea).  You may be not able to poop.  Your bowels will slowly get back to normal.  Do not eat any food that makes you sick to your stomach (nauseous). Eat small meals 4 to 6 times a day instead of 3 large ones.  Do not drink pop. It will give you gas.  Do not drink alcohol.  Do not lift anything heavier  than 10 pounds. This is about the weight of a gallon of milk.  Do not do anything that makes you very tired for at least 6 weeks.  Do not get your wound wet for 2 days.  You may take a sponge bath during this time.  After 2 days you may take a shower. Gently pat your surgical cut (incision) dry with a towel. Do not rub it.  For men: You may have been given an athletic supporter (scrotal support) before you left the hospital. It holds your scrotum and testicles closer to your body so there is no strain on your wound. Wear the supporter until your doctor tells you that you do not need it anymore. GET HELP RIGHT AWAY IF:  You have watery poop, or cannot poop for more than 3 days.  You feel sick to your stomach or throw up (vomit) more than 2 or 3 times.  You have temperature by mouth above 102 F (38.9 C).  You see redness or puffiness (swelling) around your wound.  You see yellowish white fluid (pus) coming from your wound.  You see a bulge or bump in your lower belly (abdomen) or near your groin.  You develop a rash, trouble breathing, or any other symptoms from medicines taken. MAKE SURE YOU:  Understand these instructions.  Will watch your condition.  Will get help right away if your are not doing well or get worse. Document Released: 05/21/2008 Document Revised: 08/31/2011 Document Reviewed: 05/21/2008 [facility] Patient Information 2013 [place], [place].

## 2012-09-01 ENCOUNTER — Other Ambulatory Visit: Payer: Self-pay

## 2012-09-01 ENCOUNTER — Encounter (HOSPITAL_COMMUNITY): Payer: Self-pay

## 2012-09-01 ENCOUNTER — Encounter (HOSPITAL_COMMUNITY)
Admission: RE | Admit: 2012-09-01 | Discharge: 2012-09-01 | Disposition: A | Discharge status: Home / Self Care | Payer: Medicare Other | Source: Ambulatory Visit | Attending: General Surgery | Admitting: General Surgery

## 2012-09-01 HISTORY — DX: Anxiety disorder, unspecified: F41.9

## 2012-09-01 HISTORY — DX: Unspecified osteoarthritis, unspecified site: M19.90

## 2012-09-01 LAB — CBC WITH DIFFERENTIAL/PLATELET
Eosinophils Absolute: 0.3 10*3/uL (ref 0.0–0.7)
HCT: 44.7 % (ref 39.0–52.0)
Hemoglobin: 15 g/dL (ref 13.0–17.0)
Lymphs Abs: 2.1 10*3/uL (ref 0.7–4.0)
MCH: 32.1 pg (ref 26.0–34.0)
Monocytes Absolute: 0.4 10*3/uL (ref 0.1–1.0)
Monocytes Relative: 8 % (ref 3–12)
Neutrophils Relative %: 50 % (ref 43–77)
RBC: 4.67 MIL/uL (ref 4.22–5.81)

## 2012-09-01 LAB — BASIC METABOLIC PANEL
CO2: 27 mEq/L (ref 19–32)
Chloride: 101 mEq/L (ref 96–112)
GFR calc non Af Amer: 81 mL/min — ABNORMAL LOW (ref >90)
Glucose, Bld: 107 mg/dL — ABNORMAL HIGH (ref 70–99)
Potassium: 4.8 mEq/L (ref 3.5–5.1)
Sodium: 139 mEq/L (ref 135–145)

## 2012-09-01 LAB — SURGICAL PCR SCREEN: Staphylococcus aureus: NEGATIVE

## 2012-09-05 ENCOUNTER — Encounter (HOSPITAL_COMMUNITY): Payer: Self-pay | Admitting: *Deleted

## 2012-09-05 ENCOUNTER — Encounter (HOSPITAL_COMMUNITY)
Admission: RE | Disposition: A | Discharge status: Home / Self Care | Payer: Self-pay | Source: Ambulatory Visit | Attending: General Surgery

## 2012-09-05 ENCOUNTER — Encounter (HOSPITAL_COMMUNITY): Payer: Self-pay | Admitting: Anesthesiology

## 2012-09-05 ENCOUNTER — Ambulatory Visit (HOSPITAL_COMMUNITY): Payer: Medicare Other | Admitting: Anesthesiology

## 2012-09-05 ENCOUNTER — Ambulatory Visit (HOSPITAL_COMMUNITY)
Admission: RE | Admit: 2012-09-05 | Discharge: 2012-09-05 | Disposition: A | Discharge status: Home / Self Care | Payer: Medicare Other | Source: Ambulatory Visit | Attending: General Surgery | Admitting: General Surgery

## 2012-09-05 DIAGNOSIS — Z01810 Encounter for preprocedural cardiovascular examination: Secondary | ICD-10-CM | POA: Insufficient documentation

## 2012-09-05 DIAGNOSIS — Z01812 Encounter for preprocedural laboratory examination: Secondary | ICD-10-CM | POA: Insufficient documentation

## 2012-09-05 DIAGNOSIS — K4090 Unilateral inguinal hernia, without obstruction or gangrene, not specified as recurrent: Secondary | ICD-10-CM | POA: Insufficient documentation

## 2012-09-05 HISTORY — PX: INSERTION OF MESH: SHX5868

## 2012-09-05 HISTORY — PX: INGUINAL HERNIA REPAIR: SHX194

## 2012-09-05 SURGERY — REPAIR, HERNIA, INGUINAL, ADULT
Anesthesia: Monitor Anesthesia Care | Site: Abdomen | Laterality: Left | Wound class: Clean

## 2012-09-05 MED ORDER — FENTANYL CITRATE 0.05 MG/ML IJ SOLN: 25.0000 ug | INTRAMUSCULAR | Status: DC | PRN

## 2012-09-05 MED ORDER — ENOXAPARIN SODIUM 30 MG/0.3ML ~~LOC~~ SOLN: 30.0000 mg | Freq: Once | SUBCUTANEOUS | Status: DC

## 2012-09-05 MED ORDER — MIDAZOLAM HCL 2 MG/2ML IJ SOLN
1.0000 mg | INTRAMUSCULAR | Status: DC | PRN
  Administered 2012-09-05: 2 mg via INTRAVENOUS

## 2012-09-05 MED ORDER — BUPIVACAINE HCL (PF) 0.5 % IJ SOLN
INTRAMUSCULAR | Status: DC | PRN
  Administered 2012-09-05: 10 mL

## 2012-09-05 MED ORDER — VANCOMYCIN HCL IN DEXTROSE 1-5 GM/200ML-% IV SOLN
1000.0000 mg | INTRAVENOUS | Status: AC
  Administered 2012-09-05: 1000 mg via INTRAVENOUS

## 2012-09-05 MED ORDER — HYDROCODONE-ACETAMINOPHEN 5-325 MG PO TABS: 1.0000 | ORAL_TABLET | Freq: Four times a day (QID) | ORAL | Status: DC | PRN

## 2012-09-05 MED ORDER — PROPOFOL 10 MG/ML IV EMUL
INTRAVENOUS | Status: AC
  Filled 2012-09-05: qty 20

## 2012-09-05 MED ORDER — VANCOMYCIN HCL IN DEXTROSE 1-5 GM/200ML-% IV SOLN
INTRAVENOUS | Status: AC
  Filled 2012-09-05: qty 200

## 2012-09-05 MED ORDER — ENOXAPARIN SODIUM 30 MG/0.3ML ~~LOC~~ SOLN
SUBCUTANEOUS | Status: AC
  Filled 2012-09-05: qty 0.3

## 2012-09-05 MED ORDER — LACTATED RINGERS IV SOLN
INTRAVENOUS | Status: DC
  Administered 2012-09-05: 09:00:00 via INTRAVENOUS
  Administered 2012-09-05: 1000 mL via INTRAVENOUS

## 2012-09-05 MED ORDER — LIDOCAINE IN DEXTROSE 5-7.5 % IV SOLN
INTRAVENOUS | Status: AC
  Filled 2012-09-05: qty 2

## 2012-09-05 MED ORDER — FENTANYL CITRATE 0.05 MG/ML IJ SOLN
INTRAMUSCULAR | Status: AC
  Filled 2012-09-05: qty 2

## 2012-09-05 MED ORDER — KETOROLAC TROMETHAMINE 30 MG/ML IJ SOLN
30.0000 mg | Freq: Once | INTRAMUSCULAR | Status: AC
  Administered 2012-09-05: 30 mg via INTRAVENOUS

## 2012-09-05 MED ORDER — SODIUM CHLORIDE 0.9 % IR SOLN
Status: DC | PRN
  Administered 2012-09-05: 1

## 2012-09-05 MED ORDER — FENTANYL CITRATE 0.05 MG/ML IJ SOLN
INTRAMUSCULAR | Status: DC | PRN
  Administered 2012-09-05 (×2): 50 ug via INTRAVENOUS

## 2012-09-05 MED ORDER — ONDANSETRON HCL 4 MG/2ML IJ SOLN: 4.0000 mg | Freq: Once | INTRAMUSCULAR | Status: DC | PRN

## 2012-09-05 MED ORDER — LIDOCAINE HCL (PF) 1 % IJ SOLN
INTRAMUSCULAR | Status: AC
  Filled 2012-09-05: qty 5

## 2012-09-05 MED ORDER — MIDAZOLAM HCL 2 MG/2ML IJ SOLN
INTRAMUSCULAR | Status: AC
  Filled 2012-09-05: qty 2

## 2012-09-05 MED ORDER — KETOROLAC TROMETHAMINE 30 MG/ML IJ SOLN
INTRAMUSCULAR | Status: AC
  Filled 2012-09-05: qty 1

## 2012-09-05 MED ORDER — PROPOFOL INFUSION 10 MG/ML OPTIME
INTRAVENOUS | Status: DC | PRN
  Administered 2012-09-05: 60 ug/kg/min via INTRAVENOUS

## 2012-09-05 MED ORDER — BUPIVACAINE HCL (PF) 0.5 % IJ SOLN
INTRAMUSCULAR | Status: AC
  Filled 2012-09-05: qty 30

## 2012-09-05 MED ORDER — CHLORHEXIDINE GLUCONATE 4 % EX LIQD: 1.0000 "application " | Freq: Once | CUTANEOUS | Status: DC

## 2012-09-05 MED ORDER — LACTATED RINGERS IV SOLN
INTRAVENOUS | Status: DC | PRN
  Administered 2012-09-05: 09:00:00 via INTRAVENOUS

## 2012-09-05 MED ORDER — PROPOFOL 10 MG/ML IV BOLUS
INTRAVENOUS | Status: DC | PRN
  Administered 2012-09-05: 10 mg via INTRAVENOUS

## 2012-09-05 SURGICAL SUPPLY — 41 items
ADH SKN CLS APL DERMABOND .7 (GAUZE/BANDAGES/DRESSINGS) ×1
BAG HAMPER (MISCELLANEOUS) ×2 IMPLANT
CLOTH BEACON ORANGE TIMEOUT ST (SAFETY) ×2 IMPLANT
COVER LIGHT HANDLE STERIS (MISCELLANEOUS) ×4 IMPLANT
DECANTER SPIKE VIAL GLASS SM (MISCELLANEOUS) ×2 IMPLANT
DERMABOND ADVANCED (GAUZE/BANDAGES/DRESSINGS) ×1
DERMABOND ADVANCED .7 DNX12 (GAUZE/BANDAGES/DRESSINGS) ×1 IMPLANT
DRAIN PENROSE 18X.75 LTX STRL (MISCELLANEOUS) ×2 IMPLANT
ELECT REM PT RETURN 9FT ADLT (ELECTROSURGICAL) ×2
ELECTRODE REM PT RTRN 9FT ADLT (ELECTROSURGICAL) ×1 IMPLANT
FORMALIN 10 PREFIL 120ML (MISCELLANEOUS) IMPLANT
GLOVE BIO SURGEON STRL SZ7.5 (GLOVE) ×2 IMPLANT
GLOVE BIOGEL PI IND STRL 7.0 (GLOVE) IMPLANT
GLOVE BIOGEL PI INDICATOR 7.0 (GLOVE) ×2
GLOVE ECLIPSE 6.5 STRL STRAW (GLOVE) ×1 IMPLANT
GLOVE ECLIPSE 7.0 STRL STRAW (GLOVE) ×1 IMPLANT
GOWN STRL REIN XL XLG (GOWN DISPOSABLE) ×6 IMPLANT
INST SET MINOR GENERAL (KITS) ×2 IMPLANT
KIT ROOM TURNOVER APOR (KITS) ×2 IMPLANT
MANIFOLD NEPTUNE II (INSTRUMENTS) ×2 IMPLANT
MESH HERNIA 1.6X1.9 PLUG LRG (Mesh General) ×1 IMPLANT
MESH HERNIA PLUG LRG (Mesh General) ×1 IMPLANT
NDL HYPO 25X1 1.5 SAFETY (NEEDLE) ×1 IMPLANT
NEEDLE HYPO 25X1 1.5 SAFETY (NEEDLE) ×2 IMPLANT
NS IRRIG 1000ML POUR BTL (IV SOLUTION) ×2 IMPLANT
PACK MINOR (CUSTOM PROCEDURE TRAY) ×2 IMPLANT
PAD ARMBOARD 7.5X6 YLW CONV (MISCELLANEOUS) ×2 IMPLANT
SET BASIN LINEN APH (SET/KITS/TRAYS/PACK) ×2 IMPLANT
SOL PREP PROV IODINE SCRUB 4OZ (MISCELLANEOUS) ×2 IMPLANT
SUT NOVA NAB GS-22 2 2-0 T-19 (SUTURE) ×3 IMPLANT
SUT NOVAFIL NAB HGS22 2-0 30IN (SUTURE) IMPLANT
SUT SILK 3 0 (SUTURE)
SUT SILK 3-0 18XBRD TIE 12 (SUTURE) IMPLANT
SUT VIC AB 2-0 CT1 27 (SUTURE) ×2
SUT VIC AB 2-0 CT1 TAPERPNT 27 (SUTURE) ×1 IMPLANT
SUT VIC AB 3-0 SH 27 (SUTURE) ×2
SUT VIC AB 3-0 SH 27X BRD (SUTURE) ×1 IMPLANT
SUT VIC AB 4-0 PS2 27 (SUTURE) ×2 IMPLANT
SUT VICRYL AB 3 0 TIES (SUTURE) IMPLANT
SYR CONTROL 10ML LL (SYRINGE) ×2 IMPLANT
TOWEL OR 17X26 4PK STRL BLUE (TOWEL DISPOSABLE) ×2 IMPLANT

## 2012-09-05 NOTE — Progress Notes (Signed)
Able to stand, walk several steps without assistance. States legs feel normal.

## 2012-09-05 NOTE — Anesthesia Postprocedure Evaluation (Signed)
  Anesthesia Post-op Note  Patient: [name]  Procedure(s) Performed: Procedure(s) with comments: HERNIA REPAIR INGUINAL ADULT (Left) - Left Inguinal Herniorraphy INSERTION OF MESH (Left)  Patient Location: PACU  Anesthesia Type:MAC and Spinal  Level of Consciousness: awake, alert , oriented and patient cooperative  Airway and Oxygen Therapy: Patient Spontanous Breathing  Post-op Pain: none  Post-op Assessment: Post-op Vital signs reviewed, Patient's Cardiovascular Status Stable, Respiratory Function Stable, Patent Airway, No signs of Nausea or vomiting, Adequate PO intake, Pain level controlled, No headache, No backache, No residual numbness and No residual motor weakness  Post-op Vital Signs: Reviewed and stable  Complications: No apparent anesthesia complications

## 2012-09-05 NOTE — Anesthesia Procedure Notes (Addendum)
Spinal  Start time: 09/05/2012 9:18 AM End time: 09/05/2012 9:20 AM Staffing Anesthesiologist: Laurene Footman Performed by: anesthesiologist  Preanesthetic Checklist Completed: patient identified, surgical consent, pre-op evaluation, IV checked, risks and benefits discussed and monitors and equipment checked Spinal Block Patient position: left lateral decubitus Prep: Betadine Patient monitoring: heart rate, cardiac monitor, continuous pulse ox and blood pressure Approach: left paramedian Location: L3-4 Injection technique: single-shot Needle Needle type: Spinocan  Needle gauge: 22 G Needle length: 5 cm Needle insertion depth: 5 cm Assessment Sensory level: T8

## 2012-09-05 NOTE — Op Note (Signed)
Patient:  [name]  DOB:  10-09-1926  MRN:  [id number]   Preop Diagnosis:  Left inguinal hernia  Postop Diagnosis:  Same, direct  Procedure:  Left inguinal herniorrhaphy  Surgeon:  Franky Macho, M.D.  Anes:  Spinal  Indications:  Patient is an 79 year old white male who presents with a symptomatic left inguinal hernia. The risks and benefits of the procedure including bleeding, infection, and recurrence of the hernia were fully explained to the patient, who gave informed consent.  Procedure note:  The patient was placed in the supine position after spinal anesthesia was administered. The left groin region was prepped and draped using usual sterile technique with Betadine. Surgical site confirmation was performed.  A transverse incision was made in the left groin region down to the external oblique aponeuroses. The aponeuroses was incised to the external ring. A Penrose drain was placed around the spermatic cord. The vas deferens was noted within the spermatic cord. The ilioinguinal nerve was identified and retracted superiorly from the operative field. A direct hernia sac was found. This was freed away from the spermatic cord to the peritoneal reflection. It was inverted and a large size Bard PerFix plug was then inserted and secured to the transversalis fascia using 2-0 Novafil interrupted sutures. An onlay patch was then placed along the floor of inguinal canal and secured superiorly to the conjoined tendon and inferiorly to the shelving edge of Poupart's ligament using 2-0 Novafil interrupted sutures. The internal ring was recreated using a 2-0 Novafil interrupted suture. The external oblique aponeuroses was reapproximated using a 2-0 Vicryl running suture. Subcutaneous layer was reapproximated using a 3-0 Vicryl interrupted suture. The skin was closed using a 4 Vicryl subcuticular suture. 0.5% Sensorcaine was instilled the surrounding wound. Dermabond was then applied.  All tape and  needle counts were correct at the end of the procedure. The patient was transferred to PACU in stable condition.  Complications:  None  EBL:  Minimal  Specimen:  None

## 2012-09-05 NOTE — Interval H&P Note (Signed)
History and Physical Interval Note:  09/05/2012 8:43 AM  [name]  has presented today for surgery, with the diagnosis of left inguinal hernia   The various methods of treatment have been discussed with the patient and family. After consideration of risks, benefits and other options for treatment, the patient has consented to  Procedure(s): HERNIA REPAIR INGUINAL ADULT (Left) as a surgical intervention .  The patient's history has been reviewed, patient examined, no change in status, stable for surgery.  I have reviewed the patient's chart and labs.  Questions were answered to the patient's satisfaction.     [name]

## 2012-09-05 NOTE — Preoperative (Signed)
Beta Blockers   Reason not to administer Beta Blockers:Not Applicable 

## 2012-09-05 NOTE — Transfer of Care (Signed)
Immediate Anesthesia Transfer of Care Note  Patient: [name]  Procedure(s) Performed: Procedure(s) with comments: HERNIA REPAIR INGUINAL ADULT (Left) - Left Inguinal Herniorraphy INSERTION OF MESH (Left)  Patient Location: PACU  Anesthesia Type:MAC and Spinal  Level of Consciousness: awake, alert , oriented and patient cooperative  Airway & Oxygen Therapy: Patient Spontanous Breathing and Patient connected to nasal cannula oxygen  Post-op Assessment: Report given to PACU RN and Post -op Vital signs reviewed and stable  Post vital signs: Reviewed and stable  Complications: No apparent anesthesia complications

## 2012-09-05 NOTE — Anesthesia Preprocedure Evaluation (Signed)
Anesthesia Evaluation  Patient identified by MRN, date of birth, ID band Patient awake    Reviewed: Allergy & Precautions, H&P , NPO status , Patient's Chart, lab work & pertinent test results  Airway Mallampati: I TM Distance: >3 FB     Dental  (+) Partial Upper and Teeth Intact   Pulmonary neg pulmonary ROS,  breath sounds clear to auscultation        Cardiovascular DVT negative cardio ROS  Rhythm:Regular Rate:Normal     Neuro/Psych Anxiety    GI/Hepatic negative GI ROS,   Endo/Other    Renal/GU      Musculoskeletal   Abdominal   Peds  Hematology   Anesthesia Other Findings   Reproductive/Obstetrics                           Anesthesia Physical Anesthesia Plan  ASA: II  Anesthesia Plan: Spinal   Post-op Pain Management:    Induction: Intravenous  Airway Management Planned: Nasal Cannula  Additional Equipment:   Intra-op Plan:   Post-operative Plan:   Informed Consent: I have reviewed the patients History and Physical, chart, labs and discussed the procedure including the risks, benefits and alternatives for the proposed anesthesia with the patient or authorized representative who has indicated his/her understanding and acceptance.     Plan Discussed with:   Anesthesia Plan Comments:         Anesthesia Quick Evaluation

## 2012-09-05 NOTE — OR Nursing (Signed)
Wife states that patient received lovenox in ED and 2008  And patient started with nausea and vomiting immediately. Dr. Lovell Sheehan    Notified and lovenoz held per order Dr. Lovell Sheehan.

## 2012-09-06 NOTE — Anesthesia Postprocedure Evaluation (Signed)
  Anesthesia Post-op Note  Patient: [name]  Procedure(s) Performed: Procedure(s) with comments: HERNIA REPAIR INGUINAL ADULT (Left) - Left Inguinal Herniorraphy INSERTION OF MESH (Left)  Patient Location: 3A  Anesthesia Type:Spinal  Level of Consciousness: awake, alert , oriented and patient cooperative  Airway and Oxygen Therapy: Patient Spontanous Breathing  Post-op Pain: 3 /10, mild  Post-op Assessment: Post-op Vital signs reviewed, Patient's Cardiovascular Status Stable, Respiratory Function Stable, Patent Airway and Pain level controlled  Post-op Vital Signs: Reviewed and stable  Complications: No apparent anesthesia complications

## 2012-09-06 NOTE — Addendum Note (Signed)
Addendum created 09/06/12 1019 by Despina Hidden, CRNA   Modules edited: Notes Section   Notes Section:  File: [id number]

## 2012-09-07 ENCOUNTER — Encounter (HOSPITAL_COMMUNITY): Payer: Self-pay | Admitting: General Surgery

## 2013-05-10 ENCOUNTER — Encounter (HOSPITAL_COMMUNITY): Payer: Self-pay | Admitting: Pharmacy Technician

## 2013-05-11 ENCOUNTER — Encounter (HOSPITAL_COMMUNITY)
Admission: RE | Admit: 2013-05-11 | Discharge: 2013-05-11 | Disposition: A | Discharge status: Home / Self Care | Payer: Medicare Other | Source: Ambulatory Visit | Attending: Urology | Admitting: Urology

## 2013-05-11 ENCOUNTER — Encounter (HOSPITAL_COMMUNITY): Payer: Self-pay

## 2013-05-11 DIAGNOSIS — Z01812 Encounter for preprocedural laboratory examination: Secondary | ICD-10-CM | POA: Insufficient documentation

## 2013-05-11 DIAGNOSIS — Z01818 Encounter for other preprocedural examination: Secondary | ICD-10-CM | POA: Insufficient documentation

## 2013-05-11 HISTORY — DX: Gastro-esophageal reflux disease without esophagitis: K21.9

## 2013-05-11 HISTORY — DX: Sleep apnea, unspecified: G47.30

## 2013-05-11 HISTORY — DX: Benign prostatic hyperplasia without lower urinary tract symptoms: N40.0

## 2013-05-11 LAB — BASIC METABOLIC PANEL
Calcium: 9.8 mg/dL (ref 8.4–10.5)
Chloride: 98 mEq/L (ref 96–112)
Creatinine, Ser: 0.81 mg/dL (ref 0.50–1.35)
GFR calc Af Amer: >90 mL/min (ref >90)

## 2013-05-11 LAB — HEMOGLOBIN AND HEMATOCRIT, BLOOD
HCT: 44 % (ref 39.0–52.0)
Hemoglobin: 15 g/dL (ref 13.0–17.0)

## 2013-05-11 NOTE — Progress Notes (Signed)
05/11/13 1347  OBSTRUCTIVE SLEEP APNEA  Have you ever been diagnosed with sleep apnea through a sleep study? No  Do you snore loudly (loud enough to be heard through closed doors)?  1  Do you often feel tired, fatigued, or sleepy during the daytime? 1  Has anyone observed you stop breathing during your sleep? 1  Do you have, or are you being treated for high blood pressure? 0  BMI more than 35 kg/m2? 0  Age over 77 years old? 1  Neck circumference greater than 40 cm/18 inches? 0  Gender: 1  Obstructive Sleep Apnea Score 5

## 2013-05-11 NOTE — Patient Instructions (Signed)
[name]  05/11/2013   Your procedure is scheduled on:  05/16/13  Report to [facility] at 06:15 AM.  Call this number if you have problems the morning of surgery: [phone]   Remember:   Do not eat food or drink liquids after midnight.   Take these medicines the morning of surgery with A SIP OF WATER: Flomax.   Do not wear jewelry, make-up or nail polish.  Do not wear lotions, powders, or perfumes.   Do not shave 48 hours prior to surgery. Men may shave face and neck.  Do not bring valuables to the hospital.  [facility] is not responsible for any belongings or valuables.               Contacts, dentures or bridgework may not be worn into surgery.  Leave suitcase in the car. After surgery it may be brought to your room.  For patients admitted to the hospital, discharge time is determined by your treatment team.               Patients discharged the day of surgery will not be allowed to drive home.    Special Instructions: Shower using CHG 2 nights before surgery and the night before surgery.  If you shower the day of surgery use CHG.  Use special wash - you have one bottle of CHG for all showers.  You should use approximately 1/3 of the bottle for each shower.   Please read over the following fact sheets that you were given: Pain Booklet, Surgical Site Infection Prevention, Anesthesia Post-op Instructions and Care and Recovery After Surgery    Transurethral Resection of the Prostate Transurethral resection of the prostate (TURP) is the removal of part of your prostate to treat noncancerous (benign) prostatic hyperplasia (BPH). BPH typically occurs in men older than 40 years. It is the abnormal growth of cells in your prostate. Specifically, it is an abnormal increase in the number of cells that make up your prostate tissue. This causes an increase in the size of your prostate. Often, in the case of BPH, the prostate becomes so large that it compresses the tube that drains  urine out of your body from your bladder (urethra). Eventually, this compression can obstruct the flow of urine from your bladder. This obstruction can cause recurrent bladder infection and difficulties with bladder control and bladder emptying. The goal of TURP is to remove enough prostate tissue to allow for an unobstructed flow of urine, which often resolves the associated conditions. LET YOUR CAREGIVER KNOW ABOUT:  Any allergies you have.  Any medicines you are taking, including herbs, eye drops, over-the-counter medicines, and creams.  Any problems you have had with the use of anesthetics.  Any blood disorders you have, including bleeding problems and clotting problems.  Previous surgeries you have had.  Any prostate infections you have had. RISKS AND COMPLICATIONS Generally, TURP is a safe procedure. However, as with any surgical procedure, complications can occur. Possible complications associated with TURP include:  Difficulty getting an erection.  Scarring, which may cause problems with the flow of your urine.  Injury to your urethra.  Incontinence from injury to the muscle that surrounds your prostate, which controls urine flow.  Infection.  Bleeding.  Injury to your bladder (rare). BEFORE THE PROCEDURE  Your caregiver will tell you when you need to stop eating and drinking. If you take any medicines, your caregiver will tell you which ones you may keep taking and which ones  you will have to stop taking and when.  Just before the procedure you will also receive medicine to make you fall asleep (general anesthetic). This will be given through a tube that is inserted into one of your veins (intravenous [IV] tube). PROCEDURE Your surgeon inserts an instrument that is similar to a telescope with an electric cutting edge (resectoscope) through your urethra to the area of the prostate gland. The cutting edge is used to remove enlarged pieces of your prostate, one piece at a  time. At the end of your procedure, a flexible tube (catheter) will be inserted into your urethra to drain your bladder. Special plastic bags filled with solution will be connected to the end of the catheter. The solution will be used to irrigate blood from your bladder while you heal.  AFTER THE PROCEDURE You will be taken to the recovery area. Once you are awake, stable, and taking fluids well, you will be taken to your hospital room. Typically, you will stay in the hospital 1 2 days after this procedure. The catheter usually is removed before discharge from the hospital. Document Released: 06/08/2005 Document Revised: 03/02/2012 Document Reviewed: 11/09/2011 [facility] Patient Information 2014 [place], [place].    PATIENT INSTRUCTIONS POST-ANESTHESIA  IMMEDIATELY FOLLOWING SURGERY:  Do not drive or operate machinery for the first twenty four hours after surgery.  Do not make any important decisions for twenty four hours after surgery or while taking narcotic pain medications or sedatives.  If you develop intractable nausea and vomiting or a severe headache please notify your doctor immediately.  FOLLOW-UP:  Please make an appointment with your surgeon as instructed. You do not need to follow up with anesthesia unless specifically instructed to do so.  WOUND CARE INSTRUCTIONS (if applicable):  Keep a dry clean dressing on the anesthesia/puncture wound site if there is drainage.  Once the wound has quit draining you may leave it open to air.  Generally you should leave the bandage intact for twenty four hours unless there is drainage.  If the epidural site drains for more than 36-48 hours please call the anesthesia department.  QUESTIONS?:  Please feel free to call your physician or the hospital operator if you have any questions, and they will be happy to assist you.

## 2013-05-15 NOTE — OR Nursing (Signed)
Dr. Jerre Simon office called to remind him that we need a   H&P for in the  Morning . Also Trish in medical records notified that he will be doing a H&P , don't have a job number yet.

## 2013-05-16 ENCOUNTER — Observation Stay (HOSPITAL_COMMUNITY)
Admission: RE | Admit: 2013-05-16 | Discharge: 2013-05-17 | Disposition: A | Discharge status: Home / Self Care | Payer: Medicare Other | Source: Ambulatory Visit | Attending: Urology | Admitting: Urology

## 2013-05-16 ENCOUNTER — Encounter (HOSPITAL_COMMUNITY): Payer: Medicare Other | Admitting: Anesthesiology

## 2013-05-16 ENCOUNTER — Ambulatory Visit (HOSPITAL_COMMUNITY): Payer: Medicare Other | Admitting: Anesthesiology

## 2013-05-16 ENCOUNTER — Encounter (HOSPITAL_COMMUNITY)
Admission: RE | Disposition: A | Discharge status: Home / Self Care | Payer: Self-pay | Source: Ambulatory Visit | Attending: Urology

## 2013-05-16 ENCOUNTER — Encounter (HOSPITAL_COMMUNITY): Payer: Self-pay | Admitting: *Deleted

## 2013-05-16 DIAGNOSIS — Z01812 Encounter for preprocedural laboratory examination: Secondary | ICD-10-CM | POA: Insufficient documentation

## 2013-05-16 DIAGNOSIS — N400 Enlarged prostate without lower urinary tract symptoms: Principal | ICD-10-CM | POA: Insufficient documentation

## 2013-05-16 HISTORY — PX: TRANSURETHRAL RESECTION OF PROSTATE: SHX73

## 2013-05-16 SURGERY — TURP (TRANSURETHRAL RESECTION OF PROSTATE)
Anesthesia: Spinal | Wound class: Clean Contaminated

## 2013-05-16 MED ORDER — HYDROMORPHONE HCL PF 1 MG/ML IJ SOLN
0.5000 mg | INTRAMUSCULAR | Status: DC | PRN
  Administered 2013-05-16 – 2013-05-17 (×2): 0.5 mg via INTRAVENOUS
  Filled 2013-05-16 (×3): qty 1

## 2013-05-16 MED ORDER — CIPROFLOXACIN IN D5W 200 MG/100ML IV SOLN
INTRAVENOUS | Status: AC
  Filled 2013-05-16: qty 100

## 2013-05-16 MED ORDER — FENTANYL CITRATE 0.05 MG/ML IJ SOLN
INTRAMUSCULAR | Status: AC
  Filled 2013-05-16: qty 2

## 2013-05-16 MED ORDER — DIPHENHYDRAMINE HCL 25 MG PO CAPS: 25.0000 mg | ORAL_CAPSULE | Freq: Four times a day (QID) | ORAL | Status: DC | PRN

## 2013-05-16 MED ORDER — FENTANYL CITRATE 0.05 MG/ML IJ SOLN: 25.0000 ug | INTRAMUSCULAR | Status: DC | PRN

## 2013-05-16 MED ORDER — FENTANYL CITRATE 0.05 MG/ML IJ SOLN
INTRAMUSCULAR | Status: DC | PRN
  Administered 2013-05-16: 30 ug via INTRAVENOUS
  Administered 2013-05-16: 20 ug via INTRATHECAL

## 2013-05-16 MED ORDER — STERILE WATER FOR IRRIGATION IR SOLN
Status: DC | PRN
  Administered 2013-05-16: 1000 mL

## 2013-05-16 MED ORDER — PROPOFOL INFUSION 10 MG/ML OPTIME
INTRAVENOUS | Status: DC | PRN
  Administered 2013-05-16: 50 ug/kg/min via INTRAVENOUS

## 2013-05-16 MED ORDER — MIDAZOLAM HCL 2 MG/2ML IJ SOLN
INTRAMUSCULAR | Status: AC
  Filled 2013-05-16: qty 2

## 2013-05-16 MED ORDER — LIDOCAINE HCL (PF) 1 % IJ SOLN
INTRAMUSCULAR | Status: AC
  Filled 2013-05-16: qty 5

## 2013-05-16 MED ORDER — ALPRAZOLAM 0.25 MG PO TABS
0.2500 mg | ORAL_TABLET | Freq: Every evening | ORAL | Status: DC | PRN
  Administered 2013-05-16: 0.25 mg via ORAL
  Filled 2013-05-16: qty 1

## 2013-05-16 MED ORDER — ACETAMINOPHEN 500 MG PO TABS: 500.0000 mg | ORAL_TABLET | Freq: Four times a day (QID) | ORAL | Status: DC | PRN

## 2013-05-16 MED ORDER — LIDOCAINE HCL (CARDIAC) 10 MG/ML IV SOLN
INTRAVENOUS | Status: DC | PRN
  Administered 2013-05-16: 50 mg via INTRAVENOUS

## 2013-05-16 MED ORDER — ONDANSETRON HCL 4 MG/2ML IJ SOLN: 4.0000 mg | Freq: Once | INTRAMUSCULAR | Status: DC | PRN

## 2013-05-16 MED ORDER — EPHEDRINE SULFATE 50 MG/ML IJ SOLN
INTRAMUSCULAR | Status: AC
  Filled 2013-05-16: qty 1

## 2013-05-16 MED ORDER — EPHEDRINE SULFATE 50 MG/ML IJ SOLN
INTRAMUSCULAR | Status: DC | PRN
  Administered 2013-05-16 (×2): 10 mg via INTRAVENOUS

## 2013-05-16 MED ORDER — SODIUM CHLORIDE BACTERIOSTATIC 0.9 % IJ SOLN
INTRAMUSCULAR | Status: AC
  Filled 2013-05-16: qty 10

## 2013-05-16 MED ORDER — MIDAZOLAM HCL 2 MG/2ML IJ SOLN
1.0000 mg | INTRAMUSCULAR | Status: DC | PRN
  Administered 2013-05-16: 2 mg via INTRAVENOUS

## 2013-05-16 MED ORDER — CIPROFLOXACIN IN D5W 200 MG/100ML IV SOLN
200.0000 mg | Freq: Once | INTRAVENOUS | Status: AC
  Administered 2013-05-16: 200 mg via INTRAVENOUS

## 2013-05-16 MED ORDER — GLYCINE 1.5 % IR SOLN
Status: DC | PRN
  Administered 2013-05-16: 3000 mL

## 2013-05-16 MED ORDER — PROPOFOL 10 MG/ML IV EMUL
INTRAVENOUS | Status: AC
  Filled 2013-05-16: qty 20

## 2013-05-16 MED ORDER — LACTATED RINGERS IV SOLN
INTRAVENOUS | Status: DC
  Administered 2013-05-16: 1000 mL via INTRAVENOUS

## 2013-05-16 MED ORDER — LACTATED RINGERS IV SOLN
INTRAVENOUS | Status: DC | PRN
  Administered 2013-05-16 (×2): via INTRAVENOUS

## 2013-05-16 MED ORDER — SODIUM CHLORIDE BACTERIOSTATIC 0.9 % IJ SOLN
INTRAMUSCULAR | Status: AC
  Filled 2013-05-16: qty 20

## 2013-05-16 MED ORDER — BUPIVACAINE IN DEXTROSE 0.75-8.25 % IT SOLN
INTRATHECAL | Status: AC
  Filled 2013-05-16: qty 2

## 2013-05-16 MED ORDER — ARTIFICIAL TEARS OP OINT
TOPICAL_OINTMENT | OPHTHALMIC | Status: AC
  Filled 2013-05-16: qty 3.5

## 2013-05-16 MED ORDER — GLYCINE 1.5 % IR SOLN
Status: DC | PRN
  Administered 2013-05-16 (×5): 3000 mL

## 2013-05-16 MED ORDER — BUPIVACAINE IN DEXTROSE 0.75-8.25 % IT SOLN
INTRATHECAL | Status: DC | PRN
  Administered 2013-05-16: 15 mg via INTRATHECAL

## 2013-05-16 MED ORDER — PHENYLEPHRINE HCL 10 MG/ML IJ SOLN
INTRAMUSCULAR | Status: AC
  Filled 2013-05-16: qty 1

## 2013-05-16 MED ORDER — FENTANYL CITRATE 0.05 MG/ML IJ SOLN
25.0000 ug | INTRAMUSCULAR | Status: AC
  Administered 2013-05-16 (×2): 25 ug via INTRAVENOUS

## 2013-05-16 MED ORDER — BISACODYL 5 MG PO TBEC
5.0000 mg | DELAYED_RELEASE_TABLET | Freq: Every day | ORAL | Status: DC | PRN
  Administered 2013-05-17: 5 mg via ORAL
  Filled 2013-05-16: qty 1

## 2013-05-16 MED ORDER — DEXTROSE-NACL 5-0.45 % IV SOLN
INTRAVENOUS | Status: DC
  Administered 2013-05-16 – 2013-05-17 (×2): via INTRAVENOUS

## 2013-05-16 MED ORDER — SODIUM CHLORIDE 0.9 % IR SOLN
Status: DC | PRN
  Administered 2013-05-16: 3000 mL

## 2013-05-16 SURGICAL SUPPLY — 39 items
BAG DECANTER FOR FLEXI CONT (MISCELLANEOUS) ×2 IMPLANT
BAG DRAIN URO TABLE W/ADPT NS (DRAPE) ×2 IMPLANT
BAG DRN 8 ADPR NS SKTRN CSTL (DRAPE) ×1
BAG DRN URN TUBE DRIP CHMBR (OSTOMY) ×1
BAG URINE DRAIN TURP 4L (OSTOMY) ×2 IMPLANT
CABLE HI FREQUENCY MONOPOLAR (ELECTROSURGICAL) ×2 IMPLANT
CATH FOLEY 3WAY 30CC 22F (CATHETERS) ×2 IMPLANT
CLOTH BEACON ORANGE TIMEOUT ST (SAFETY) ×2 IMPLANT
CONNECTOR 5 IN 1 STRAIGHT STRL (MISCELLANEOUS) ×2 IMPLANT
DRAPE STERI URO 23X35 APER SZ5 (DRAPE) ×2 IMPLANT
DRAPE WARM FLUID 44X44 (DRAPE) ×2 IMPLANT
ELECT CUT LOOP C-MAX 27FR .012 (CUTTING LOOP) ×4
ELECT REM PT RETURN 9FT ADLT (ELECTROSURGICAL) ×2
ELECTRODE CUT LP CMX 27FR .012 (CUTTING LOOP) ×2 IMPLANT
ELECTRODE REM PT RTRN 9FT ADLT (ELECTROSURGICAL) ×1 IMPLANT
FLOOR PAD 36X40 (MISCELLANEOUS)
FORMALIN 10 PREFIL 480ML (MISCELLANEOUS) ×1 IMPLANT
GLOVE BIO SURGEON STRL SZ7 (GLOVE) ×2 IMPLANT
GLOVE BIOGEL PI IND STRL 6.5 (GLOVE) IMPLANT
GLOVE BIOGEL PI IND STRL 7.0 (GLOVE) IMPLANT
GLOVE BIOGEL PI INDICATOR 6.5 (GLOVE) ×1
GLOVE BIOGEL PI INDICATOR 7.0 (GLOVE) ×1
GLOVE EXAM NITRILE MD LF STRL (GLOVE) ×1 IMPLANT
GLOVE SS BIOGEL STRL SZ 6.5 (GLOVE) ×1 IMPLANT
GLOVE SUPERSENSE BIOGEL SZ 6.5 (GLOVE) ×1
GLYCINE 1.5% IRRIG UROMATIC (IV SOLUTION) ×12 IMPLANT
GOWN STRL REIN XL XLG (GOWN DISPOSABLE) ×4 IMPLANT
IV NS IRRIG 3000ML ARTHROMATIC (IV SOLUTION) ×2 IMPLANT
KIT ROOM TURNOVER AP CYSTO (KITS) ×2 IMPLANT
MANIFOLD NEPTUNE II (INSTRUMENTS) ×2 IMPLANT
PACK CYSTO (CUSTOM PROCEDURE TRAY) ×2 IMPLANT
PAD ARMBOARD 7.5X6 YLW CONV (MISCELLANEOUS) ×2 IMPLANT
PAD FLOOR 36X40 (MISCELLANEOUS) ×1 IMPLANT
SET IRRIGATING DISP (SET/KITS/TRAYS/PACK) ×2 IMPLANT
SYR 30ML LL (SYRINGE) ×2 IMPLANT
TOWEL OR 17X26 4PK STRL BLUE (TOWEL DISPOSABLE) ×2 IMPLANT
WATER STERILE IRR 1000ML POUR (IV SOLUTION) ×2 IMPLANT
XPEEDA 550 SIDEFIRING FIBER (MISCELLANEOUS) IMPLANT
YANKAUER SUCT BULB TIP 10FT TU (MISCELLANEOUS) ×2 IMPLANT

## 2013-05-16 NOTE — Anesthesia Preprocedure Evaluation (Signed)
Anesthesia Evaluation  Patient identified by MRN, date of birth, ID band Patient awake    Reviewed: Allergy & Precautions, H&P , NPO status , Patient's Chart, lab work & pertinent test results  Airway Mallampati: I TM Distance: >3 FB     Dental  (+) Partial Upper and Teeth Intact   Pulmonary neg pulmonary ROS, sleep apnea , former smoker,  breath sounds clear to auscultation        Cardiovascular DVT negative cardio ROS  Rhythm:Regular Rate:Normal     Neuro/Psych Anxiety    GI/Hepatic negative GI ROS, GERD-  ,  Endo/Other    Renal/GU      Musculoskeletal   Abdominal   Peds  Hematology   Anesthesia Other Findings   Reproductive/Obstetrics                           Anesthesia Physical Anesthesia Plan  ASA: III  Anesthesia Plan: Spinal   Post-op Pain Management:    Induction: Intravenous  Airway Management Planned: Nasal Cannula  Additional Equipment:   Intra-op Plan:   Post-operative Plan:   Informed Consent: I have reviewed the patients History and Physical, chart, labs and discussed the procedure including the risks, benefits and alternatives for the proposed anesthesia with the patient or authorized representative who has indicated his/her understanding and acceptance.     Plan Discussed with:   Anesthesia Plan Comments:         Anesthesia Quick Evaluation

## 2013-05-16 NOTE — Progress Notes (Signed)
Addendum to op note he has h/o deep vein thrombosis in the past so he can not use SCD.

## 2013-05-16 NOTE — Op Note (Signed)
Dictation [id number]

## 2013-05-16 NOTE — H&P (Signed)
NAME:  [name], [name] NO.:  [id number]  MEDICAL RECORD NO.:  [id number]  LOCATION:                                 FACILITY:  PHYSICIAN:  Ky Barban, M.D.DATE OF BIRTH:  [birth year]  DATE OF ADMISSION:  05/16/2013 DATE OF DISCHARGE:  LH                             HISTORY & PHYSICAL   HISTORY OF PRESENT ILLNESS:  This patient is 79 years old.  I am following him since August 2000.  Initially, he was referred to me by Dr. __________ because he was having symptoms of prostatism.  At that time, workup showed that he has BPH with bladder neck obstruction.  He needed to have a TUR prostate, but I also found that he has a bladder tumor.  Subsequently, he underwent partial cystectomy in 2008. Initially, I have treated him with alpha blocker with Flomax.  I also did a prostate biopsy at that time, which was negative for any cancer. Initially, he said that the Flomax is helping.  I kept him on Flomax for a long time and subsequently, he developed hematuria, was found to have bladder tumor and he underwent partial cystectomy in 2008 with negative margins, it was a high grade tumor and I have treated him with followup cystoscopy.  He, in the meantime, also in 2004 had laser ablation of the prostate.  He was happy with the results.  He was not taking any Flomax at that time.  Now, he started to have prostatism again, and there is no recurrence of his bladder tumor.  For several years, I have been following with cystoscopy and is negative for any recurrence.  He also had a left inguinal hernia and I was hoping that he can have hernia repair and TUR prostate at the same time because it was very symptomatic and the residual urine was at least 50-60 mL.  He underwent hernia repair in January and I do not whether he comes back again.  He continued to have symptoms of prostatism.  He is still taking Flomax with not very good results.  His peak flow rate is also good, but  his average flow rate is 8 mL/second and residual urine was about 51 mL so advised to undergo TUR prostate for which he will be coming as an outpatient and have the procedure done as an outpatient, will be kept overnight in the hospital for observation.  PAST MEDICAL HISTORY:  History of BPH for a long time, history of laser ablation of the prostate several years ago, and possible cystectomy in 2008 for bladder cancer high grade, no recurrence so far, left inguinal hernia repair in January 2014.  PLAN:  TUR prostate under anesthesia as an outpatient tomorrow.     Ky Barban, M.D.     MIJ/MEDQ  D:  05/15/2013  T:  05/16/2013  Job:  [id number]

## 2013-05-16 NOTE — Progress Notes (Signed)
No change in H&P on reexamination. 

## 2013-05-16 NOTE — Preoperative (Signed)
Beta Blockers   Reason not to administer Beta Blockers:Not Applicable 

## 2013-05-16 NOTE — Anesthesia Postprocedure Evaluation (Signed)
  Anesthesia Post-op Note  Patient: [name]  Procedure(s) Performed: Procedure(s): TRANSURETHRAL RESECTION OF THE PROSTATE (TURP) (N/A)  Patient Location: PACU  Anesthesia Type:Spinal  Level of Consciousness: awake, alert , oriented and patient cooperative  Airway and Oxygen Therapy: Patient Spontanous Breathing and Patient connected to nasal cannula oxygen  Post-op Pain: none  Post-op Assessment: Post-op Vital signs reviewed, Patient's Cardiovascular Status Stable, Respiratory Function Stable, Patent Airway, No signs of Nausea or vomiting and Pain level controlled  Post-op Vital Signs: Reviewed and stable  Complications: No apparent anesthesia complications

## 2013-05-16 NOTE — Transfer of Care (Signed)
Immediate Anesthesia Transfer of Care Note  Patient: [name]  Procedure(s) Performed: Procedure(s): TRANSURETHRAL RESECTION OF THE PROSTATE (TURP) (N/A)  Patient Location: PACU  Anesthesia Type:Spinal  Level of Consciousness: awake, alert , oriented and patient cooperative  Airway & Oxygen Therapy: Patient Spontanous Breathing and Patient connected to nasal cannula oxygen  Post-op Assessment: Report given to PACU RN and Post -op Vital signs reviewed and stable  Post vital signs: Reviewed and stable  Complications: No apparent anesthesia complications

## 2013-05-16 NOTE — Op Note (Signed)
NAME:  [name], [name] NO.:  [id number]  MEDICAL RECORD NO.:  [id number]  LOCATION:  [facility]                          FACILITY:  [facility]  PHYSICIAN:  Ky Barban, M.D.DATE OF BIRTH:  1926-11-28  DATE OF PROCEDURE: DATE OF DISCHARGE:                              OPERATIVE REPORT   PREOPERATIVE DIAGNOSIS:  Benign prostatic hypertrophy.  POSTOPERATIVE DIAGNOSIS:  Benign prostatic hypertrophy.  PROCEDURE:  TUR prostate.  ANESTHESIA:  Spinal.  PROCEDURE:  The patient under spinal anesthesia in lithotomy position. After usual prep and drape #28 Iglesias resectoscope was introduced under direct vision.  Anterior urethra looks normal.  Prostatic urethra is again noticed to be completely obstructed with lateral lobe hypertrophy.  Bladder is 2 to 3+ trabeculated.  There is no recurrence of the tumors seen again in the bladder.  I proceeded with the resection of the prostate.  The bladder neck was circumferentially resected down to the circular fibers.  Resectoscope was pulled back at the level of the verumontanum, rotated to 11 o'clock position.  Resection of the right lobe was started between 11 and 7 o'clock position.  Most of the adenoma was resected similarly the left lobe was resected between 1 and 5 o'clock position.  There was not as much tissue as I was expecting. There was only small amount of tissue which I resected and although visually it looks like there is a big prostate but when I actually proceed with a TURP after little bit dissection of the tissue in the prostatic urethra looks wide open.  Chips were evacuated.  Bleeders were coagulated.  After making sure there was complete hemostasis.  The resectoscope was removed and 22 three-way Foley catheter left in for drainage.  CBI started which is clear.  The patient left the operating room in satisfactory condition.     Ky Barban, M.D.     MIJ/MEDQ  D:  05/16/2013  T:  05/16/2013  Job:   [id number]

## 2013-05-16 NOTE — Anesthesia Procedure Notes (Addendum)
Date/Time: 05/16/2013 8:07 AM Performed by: Pernell Dupre, AMY A Pre-anesthesia Checklist: Patient identified, Timeout performed, Emergency Drugs available, Suction available and Patient being monitored Oxygen Delivery Method: Non-rebreather mask   Spinal  Patient location during procedure: OR Start time: 05/16/2013 8:21 AM Staffing CRNA/Resident: ADAMS, AMY A Preanesthetic Checklist Completed: patient identified, site marked, surgical consent, pre-op evaluation, timeout performed, IV checked, risks and benefits discussed and monitors and equipment checked Spinal Block Patient position: right lateral decubitus Prep: Betadine Patient monitoring: heart rate, cardiac monitor, continuous pulse ox and blood pressure Approach: right paramedian Location: L3-4 Injection technique: single-shot Needle Needle type: Spinocan  Needle gauge: 22 G Needle length: 9 cm Assessment Sensory level: T8 Additional Notes ATTEMPTS:1 TRAY ZO:[id number] TRAY EXPIRATION DATE:05/2013 Marcaine 15mg , Fentanyl , epi.1 injected intrathecally at 0821; patient tolerated well.

## 2013-05-17 ENCOUNTER — Encounter (HOSPITAL_COMMUNITY): Payer: Self-pay | Admitting: Urology

## 2013-05-17 LAB — BASIC METABOLIC PANEL
Calcium: 9 mg/dL (ref 8.4–10.5)
Chloride: 104 mEq/L (ref 96–112)
GFR calc Af Amer: >90 mL/min (ref >90)
GFR calc non Af Amer: 86 mL/min — ABNORMAL LOW (ref >90)
Potassium: 4.3 mEq/L (ref 3.5–5.1)
Sodium: 137 mEq/L (ref 135–145)

## 2013-05-17 LAB — CBC
HCT: 41.3 % (ref 39.0–52.0)
Hemoglobin: 14 g/dL (ref 13.0–17.0)
MCHC: 33.9 g/dL (ref 30.0–36.0)
Platelets: 224 10*3/uL (ref 150–400)
RDW: 12.5 % (ref 11.5–15.5)
WBC: 7.1 10*3/uL (ref 4.0–10.5)

## 2013-05-17 MED ORDER — MAGNESIUM HYDROXIDE 400 MG/5ML PO SUSP
30.0000 mL | Freq: Every day | ORAL | Status: DC
  Administered 2013-05-17: 30 mL via ORAL
  Filled 2013-05-17: qty 30

## 2013-05-17 NOTE — Progress Notes (Signed)
Dr Kyra Manges called, ordered to stop CBI, change diet to regular, and to get patient up to chair.

## 2013-05-17 NOTE — Progress Notes (Signed)
UR completed 

## 2013-05-17 NOTE — Care Management Note (Signed)
    Page 1 of 1   05/17/2013     11:39:25 AM   CARE MANAGEMENT NOTE 05/17/2013  Patient:  CHARAN, PRIETO   Account Number:  [id number]  Date Initiated:  05/17/2013  Documentation initiated by:  Sharrie Rothman  Subjective/Objective Assessment:   Pt admitted from home s/p turp. Pt lives with his wife and will return home at discharge. Pt has a walker for home use.     Action/Plan:   Pt will need to discharge with foley catheter. [facility] RN arrange per wife request for followup care for foley. Alroy Bailiff of [facility] is aware and will collect the pts information from the chart and services will start within 48 hours.   Anticipated DC Date:  05/18/2013   Anticipated DC Plan:  HOME W HOME HEALTH SERVICES      DC Planning Services  CM consult      [facility] Choice  HOME HEALTH   Choice offered to / List presented to:  C-1 Patient        HH arranged  HH-1 RN      [facility] agency  [facility]   Status of service:  Completed, signed off Medicare Important Message given?   (If response is "NO", the following Medicare IM given date fields will be blank) Date Medicare IM given:   Date Additional Medicare IM given:    Discharge Disposition:  HOME W HOME HEALTH SERVICES  Per UR Regulation:    If discussed at Long Length of Stay Meetings, dates discussed:    Comments:  05/17/13 1140 Arlyss Queen, RN BSN CM Pt possible discharge today. HH arranged. Pt and pts nurse aware of discharge arrangements.

## 2013-05-17 NOTE — H&P (Signed)
NAME:  ARTHA, CHIASSON NO.:  [id number]  MEDICAL RECORD NO.:  [id number]  LOCATION:                                 FACILITY:  PHYSICIAN:  Ky Barban, M.D.    DATE OF BIRTH:  DATE OF ADMISSION:  05/16/2013 DATE OF DISCHARGE:  LH                             HISTORY & PHYSICAL   Ms. Summer is an 79 year old gentleman who was known to have bladder cancer which has been treated with partial cystectomy several years ago. His follow up cystoscope, his urine cytologies have been negative.   INCOMPLETE DICTATION.     Ky Barban, M.D.     MIJ/MEDQ  D:  05/15/2013  T:  05/16/2013  Job:  [id number]

## 2013-05-17 NOTE — Anesthesia Postprocedure Evaluation (Signed)
  Anesthesia Post-op Note  Patient: [name]  Procedure(s) Performed: Procedure(s): TRANSURETHRAL RESECTION OF THE PROSTATE (TURP) (N/A)  Patient Location: Nursing Unit  Anesthesia Type:Spinal  Level of Consciousness: awake, alert  and oriented  Airway and Oxygen Therapy: Patient Spontanous Breathing  Post-op Pain: mild  Post-op Assessment: Post-op Vital signs reviewed, Patient's Cardiovascular Status Stable, Respiratory Function Stable, Patent Airway and No signs of Nausea or vomiting  Post-op Vital Signs: Reviewed and stable  Complications: No apparent anesthesia complications

## 2013-05-17 NOTE — Evaluation (Signed)
Physical Therapy Evaluation Patient Details Name: [name] MRN: [id number] DOB: 1926/09/01 Today's Date: 05/17/2013 Time: 4098-1191 PT Time Calculation (min): 20 min  PT Assessment / Plan / Recommendation History of Present Illness  Pt is an 79 year old gentleman who is admitted for a TURP which was performed yesterday.  He has a hx of bladder CA with a paritial cystectomy.    Clinical Impression   Pt was seen for evaluation.  He had no difficulty with transfers and was able to ambulate 200' with a walker,  He does report having had a fall at home recently in the bathroom.  I instructed in home safety techniques.  His balance is slightly decreased and I recommended having HHPT work with pt for awhile.  Wife is hesitant about this and would prefer that the Home Health RN evaluate for this need once he is at home.    PT Assessment  Patent does not need any further PT services [facility] to assess for PT per request of wife)    Follow Up Recommendations  No PT follow up    Does the patient have the potential to tolerate intense rehabilitation      Barriers to Discharge        Equipment Recommendations  None recommended by PT    Recommendations for Other Services     Frequency      Precautions / Restrictions Precautions Precautions: Fall Restrictions Weight Bearing Restrictions: No   Pertinent Vitals/Pain       Mobility  Bed Mobility Bed Mobility: Not assessed Transfers Transfers: Sit to Stand;Stand to Sit Sit to Stand: 6: Modified independent (Device/Increase time);From chair/3-in-1 Stand to Sit: 6: Modified independent (Device/Increase time);To chair/3-in-1 Ambulation/Gait Ambulation/Gait Assistance: 5: Supervision Ambulation Distance (Feet): 200 Feet Assistive device: Rolling walker Gait Pattern: Decreased step length - right;Trunk flexed Gait velocity: WNL Stairs: No Wheelchair Mobility Wheelchair Mobility: No    Exercises     PT Diagnosis:    PT  Problem List:   PT Treatment Interventions:       PT Goals(Current goals can be found in the care plan section) Acute Rehab PT Goals PT Goal Formulation: No goals set, d/c therapy  Visit Information  Last PT Received On: 05/17/13 History of Present Illness: Pt is an 79 year old gentleman who is admitted for a TURP which was performed yesterday.  He has a hx of bladder CA with a paritial cystectomy.         Prior Functioning  Home Living Family/patient expects to be discharged to:: Private residence Living Arrangements: Spouse/significant other Available Help at Discharge: Family;Available 24 hours/day Type of Home: House Home Access: Level entry Home Layout: One level Home Equipment: Walker - 2 wheels;Shower seat - built in;Bedside commode Prior Function Level of Independence: Needs assistance Gait / Transfers Assistance Needed: ambulates independently with a walker ADL's / Homemaking Assistance Needed: needs assist with a bath Communication Communication: HOH    Cognition  Cognition Arousal/Alertness: Awake/alert Behavior During Therapy: WFL for tasks assessed/performed Overall Cognitive Status: Within Functional Limits for tasks assessed    Extremity/Trunk Assessment Lower Extremity Assessment Lower Extremity Assessment: Overall WFL for tasks assessed   Balance Balance Balance Assessed: Yes Static Standing Balance Static Standing - Balance Support: No upper extremity supported Static Standing - Level of Assistance: 4: Min assist  End of Session PT - End of Session Equipment Utilized During Treatment: Gait belt Activity Tolerance: Patient tolerated treatment well Patient left: in chair;with call bell/phone within reach;with  family/visitor present Nurse Communication: Mobility status  GP     Konrad Penta 05/17/2013, 4:06 PM

## 2013-05-17 NOTE — Progress Notes (Signed)
Patient with orders to be discharge with home health. Discharge instructions given to patient and spouse, verbalized understanding. Demonstrated how to change Foley bag and empty urine, spouse did return demonstration. Patient stable. Patient left in private vehicle.

## 2013-05-17 NOTE — Progress Notes (Signed)
Report [phone]

## 2013-05-19 NOTE — Progress Notes (Signed)
NAME:  [name], [name] NO.:  [id number]  MEDICAL RECORD NO.:  [id number]  LOCATION:                                 FACILITY:  PHYSICIAN:  Ky Barban, M.D.DATE OF BIRTH:  Oct 13, 1926  DATE OF PROCEDURE:  05/17/2013 DATE OF DISCHARGE:  05/17/2013                                PROGRESS NOTE   [name] had a TUR prostate done yesterday.  Abdomen is soft.  Blood pressure is 152/75, temperature 98.5, pulse 70 per minute.  Hematocrit is 41.3.  WBC is 7.1.  His electrolytes, sodium 137, potassium 4.3, chloride 104, CO2 is 24, BUN is 5, creatinine 0.67.  General status is good.  CBI is clear.  PLAN:  Discontinue CBI, out of bed, regular diet.  I will discharge him home with Foley catheter which I can take it out on Monday.     Ky Barban, M.D.     MIJ/MEDQ  D:  05/17/2013  T:  05/18/2013  Job:  [id number]

## 2013-05-25 NOTE — Discharge Summary (Signed)
Physician Discharge Summary  Patient ID: [name] MRN: [id number] DOB/AGE: 77-Apr-1928 77 y.o.  Admit date: 05/16/2013 Discharge date: 05/25/2013  Admission Diagnoses:bph  Discharge Diagnoses:  Active Problems:   * No active hospital problems. *   Discharged Condition: stable  Hospital Course: he had a turp and first post op day urine is clear cbi discontinued and started on regular diet and oob later that day he was dc home with catheter to be removed on Monday in office.  Consults: None  Significant Diagnostic Studies:none  Treatments: see my note  Discharge Exam: Blood pressure 152/75, pulse 70, temperature 98.5 F (36.9 C), temperature source Oral, resp. rate 20, height 5\' 8"  (1.727 m), weight 66.225 kg (146 lb), SpO2 98.00%. General appearance: alert  Disposition: 01-Home or Self Care     Medication List         acetaminophen 500 MG tablet  Commonly known as:  TYLENOL  Take 500 mg by mouth every 6 (six) hours as needed for pain.     ALPRAZolam 0.25 MG tablet  Commonly known as:  XANAX  Take 0.25 mg by mouth at bedtime as needed for anxiety.     bisacodyl 5 MG EC tablet  Commonly known as:  DULCOLAX  Take 5 mg by mouth daily as needed for moderate constipation.     diphenhydrAMINE 25 MG tablet  Commonly known as:  BENADRYL  Take 25 mg by mouth every 6 (six) hours as needed for allergies.     tamsulosin 0.4 MG Caps capsule  Commonly known as:  FLOMAX  Take 0.4 mg by mouth daily.           Follow-up Information   Follow up with [facility].   Contact information:   [address] [place] [address] [phone]      Follow up In 5 days.      Follow up with Ky Barban, MD On 05/22/2013. (3pm)    Specialty:  Urology   Contact information:   [address] [place] [address] [phone]       Signed: Ky Barban 05/25/2013, 10:27 AM

## 2013-09-07 ENCOUNTER — Ambulatory Visit (INDEPENDENT_AMBULATORY_CARE_PROVIDER_SITE_OTHER): Payer: Medicare Other | Admitting: Otolaryngology

## 2013-09-07 DIAGNOSIS — J310 Chronic rhinitis: Secondary | ICD-10-CM

## 2013-09-07 DIAGNOSIS — J342 Deviated nasal septum: Secondary | ICD-10-CM

## 2013-10-05 ENCOUNTER — Ambulatory Visit (INDEPENDENT_AMBULATORY_CARE_PROVIDER_SITE_OTHER): Payer: Medicare Other | Admitting: Otolaryngology

## 2013-10-05 DIAGNOSIS — J310 Chronic rhinitis: Secondary | ICD-10-CM

## 2013-10-05 DIAGNOSIS — J343 Hypertrophy of nasal turbinates: Secondary | ICD-10-CM

## 2013-11-30 ENCOUNTER — Other Ambulatory Visit (INDEPENDENT_AMBULATORY_CARE_PROVIDER_SITE_OTHER): Payer: Self-pay | Admitting: Otolaryngology

## 2013-11-30 ENCOUNTER — Ambulatory Visit (INDEPENDENT_AMBULATORY_CARE_PROVIDER_SITE_OTHER): Payer: Medicare Other | Admitting: Otolaryngology

## 2013-11-30 DIAGNOSIS — R1312 Dysphagia, oropharyngeal phase: Secondary | ICD-10-CM

## 2013-11-30 DIAGNOSIS — R490 Dysphonia: Secondary | ICD-10-CM

## 2013-11-30 DIAGNOSIS — R1310 Dysphagia, unspecified: Secondary | ICD-10-CM

## 2013-12-04 ENCOUNTER — Ambulatory Visit (HOSPITAL_COMMUNITY)
Admission: RE | Admit: 2013-12-04 | Discharge: 2013-12-04 | Disposition: A | Discharge status: Home / Self Care | Payer: Medicare Other | Source: Ambulatory Visit | Attending: Otolaryngology | Admitting: Otolaryngology

## 2013-12-04 DIAGNOSIS — R1310 Dysphagia, unspecified: Secondary | ICD-10-CM | POA: Insufficient documentation

## 2013-12-04 DIAGNOSIS — K224 Dyskinesia of esophagus: Secondary | ICD-10-CM | POA: Insufficient documentation

## 2014-02-01 ENCOUNTER — Ambulatory Visit (HOSPITAL_COMMUNITY)
Admission: RE | Admit: 2014-02-01 | Discharge: 2014-02-01 | Disposition: A | Discharge status: Home / Self Care | Payer: Medicare Other | Source: Ambulatory Visit | Attending: Physician Assistant | Admitting: Physician Assistant

## 2014-02-01 ENCOUNTER — Other Ambulatory Visit (HOSPITAL_COMMUNITY): Payer: Self-pay | Admitting: Physician Assistant

## 2014-02-01 DIAGNOSIS — J984 Other disorders of lung: Secondary | ICD-10-CM | POA: Diagnosis present

## 2014-02-01 DIAGNOSIS — R5381 Other malaise: Secondary | ICD-10-CM | POA: Insufficient documentation

## 2014-02-01 DIAGNOSIS — R5383 Other fatigue: Principal | ICD-10-CM

## 2014-07-02 ENCOUNTER — Ambulatory Visit (INDEPENDENT_AMBULATORY_CARE_PROVIDER_SITE_OTHER): Payer: Medicare Other | Admitting: Cardiology

## 2014-07-02 ENCOUNTER — Encounter: Payer: Self-pay | Admitting: Cardiology

## 2014-07-02 DIAGNOSIS — R5383 Other fatigue: Secondary | ICD-10-CM | POA: Insufficient documentation

## 2014-07-02 DIAGNOSIS — R001 Bradycardia, unspecified: Secondary | ICD-10-CM

## 2014-07-02 NOTE — Patient Instructions (Signed)
Your physician recommends that you schedule a follow-up appointment only if needed after 24 hr holter  Your physician recommends that you continue on your current medications as directed. Please refer to the Current Medication list given to you today.   Your physician has recommended that you wear a holter monitor for 24 hrs. Holter monitors are medical devices that record the heart's electrical activity. Doctors most often use these monitors to diagnose arrhythmias. Arrhythmias are problems with the speed or rhythm of the heartbeat. The monitor is a small, portable device. You can wear one while you do your normal daily activities. This is usually used to diagnose what is causing palpitations/syncope (passing out).We will call you with the results      Thank you for choosing [name] !

## 2014-07-02 NOTE — Assessment & Plan Note (Signed)
Sinus bradycardia noted at baseline, ECG with normal intervals. With ambulation in the hall today on pulse oximeter, he had good heart rate response up into the 120s and no oxygen desaturation. He has had no syncope. It is not clear that his bradycardia is symptom provoking at this point. We will obtain a 24-hour Holter monitor to better assess baseline heart rate variability.

## 2014-07-02 NOTE — Progress Notes (Signed)
Reason for visit: Bradycardia  Clinical Summary [name] is an 81 y.o.male referred for cardiology consultation by [name]. He is here with his wife and son today. Limited records are available. His wife tells me that he has physical therapy coming in twice a week, has reportedly noticed that he is bradycardic after their exercises. He describes fatigue, although not specifically when he does his exercises, tends to be worse later in the day and evening. Also does not sleep well, gets up several times a night to urinate, and has trouble with sinus congestion and rhinorrhea. He has had no frank syncope. He is fairly unsteady on his feet, uses a walker, also wheelchair.  ECG today shows sinus bradycardia at 54 bpm, otherwise normal intervals. We had him ambulate in the hall for a few minutes with pulse oximeter in place. Heart rate got up into the 120s while he was ambulating for a few minutes, oxygen saturation was no lower than 94% on room air. At the end of exercise, his heart rate went down into the 70s. There was no documented bradycardia other than at the start.  Echocardiogram from 2008 reported mild LVH with LVEF 16% and mild diastolic dysfunction, mildly thickened aortic valve and mitral valve with Mac, no regurgitant lesions.  ECG from March 2014 showed sinus bradycardia with PVC and U waves noted.  Allergies  Allergen Reactions  . Cephalosporins     unknown  . Lovenox [Enoxaparin]     Severe nausea and vomiting   . Penicillins Swelling  . Sulfa Antibiotics Hives and Rash    Current Outpatient Prescriptions  Medication Sig Dispense Refill  . acetaminophen (TYLENOL) 500 MG tablet Take 500 mg by mouth every 6 (six) hours as needed for pain.    Marland Kitchen ALPRAZolam (XANAX) 0.25 MG tablet Take 0.25 mg by mouth at bedtime as needed for anxiety.    . bisacodyl (DULCOLAX) 5 MG EC tablet Take 5 mg by mouth daily as needed for moderate constipation.     . diphenhydrAMINE (BENADRYL) 25 MG  tablet Take 25 mg by mouth every 6 (six) hours as needed for allergies.    . fluticasone (FLONASE) 50 MCG/ACT nasal spray Place 2 sprays into both nostrils as needed for allergies or rhinitis.    . tamsulosin (FLOMAX) 0.4 MG CAPS Take 0.4 mg by mouth daily.     No current facility-administered medications for this visit.    Past Medical History  Diagnosis Date  . Anxiety   . Bladder cancer 2008  . Arthritis     Hands and ankles  . History of DVT (deep vein thrombosis) 2008  . BPH (benign prostatic hyperplasia)   . GERD (gastroesophageal reflux disease)   . Sleep apnea     Stop Bang score of 5    Past Surgical History  Procedure Laterality Date  . Prostate surgery  2004  . Bladder surgery  2008  . Inguinal hernia repair Left 09/05/2012    Procedure: HERNIA REPAIR INGUINAL ADULT;  Surgeon: Nicholas So, MD;  Location: [facility];  Service: General;  Laterality: Left;  Left Inguinal Herniorraphy  . Insertion of mesh Left 09/05/2012    Procedure: INSERTION OF MESH;  Surgeon: Nicholas So, MD;  Location: [facility];  Service: General;  Laterality: Left;  . Transurethral resection of prostate N/A 05/16/2013    Procedure: TRANSURETHRAL RESECTION OF THE PROSTATE (TURP);  Surgeon: Nicholas Nestle, MD;  Location: [facility];  Service: Urology;  Laterality: N/A;  History reviewed. No pertinent family history.  Social History [name] reports that he quit smoking about 35 years ago. His smoking use included Cigarettes. He smoked 0.00 packs per day for 39 years. He does not have any smokeless tobacco history on file. [name] reports that he does not drink alcohol.  Review of Systems Complete review of systems negative except as otherwise outlined in the clinical summary and also the following. Hard of hearing. Stable appetite. No chest pain, no claudication.  Physical Examination Filed Vitals:   07/02/14 0912  BP: 124/70  Pulse: 57   Filed Weights   07/02/14 0912  Weight: 146 lb  (66.225 kg)   Elderly male, appears comfortable at rest. HEENT: Conjunctiva and lids normal, oropharynx clear. Neck: Supple, no elevated JVP or carotid bruits, no thyromegaly. Lungs: Clear to auscultation, nonlabored breathing at rest. Cardiac: Distant regular rate and rhythm, no S3, soft systolic murmur, no pericardial rub. Abdomen: Soft, nontender, bowel sounds present, no guarding or rebound. Extremities: No pitting edema, distal pulses 2+. Skin: Warm and dry. Musculoskeletal: Mild kyphosis. Neuropsychiatric: Alert and oriented x3, affect grossly appropriate. Hard of hearing.   Problem List and Plan   Bradycardia Sinus bradycardia noted at baseline, ECG with normal intervals. With ambulation in the hall today on pulse oximeter, he had good heart rate response up into the 120s and no oxygen desaturation. He has had no syncope. It is not clear that his bradycardia is symptom provoking at this point. We will obtain a 24-hour Holter monitor to better assess baseline heart rate variability.  Fatigue This seems to be more multifactorial rather than related specifically to bradycardia.    Satira Sark, M.D., F.A.C.C.

## 2014-07-02 NOTE — Assessment & Plan Note (Signed)
This seems to be more multifactorial rather than related specifically to bradycardia.

## 2014-07-03 ENCOUNTER — Ambulatory Visit (HOSPITAL_COMMUNITY)
Admission: RE | Admit: 2014-07-03 | Discharge: 2014-07-03 | Disposition: A | Discharge status: Home / Self Care | Payer: Medicare Other | Source: Ambulatory Visit | Attending: Cardiology | Admitting: Cardiology

## 2014-07-03 DIAGNOSIS — R001 Bradycardia, unspecified: Secondary | ICD-10-CM

## 2014-07-03 DIAGNOSIS — R5383 Other fatigue: Secondary | ICD-10-CM

## 2014-07-03 NOTE — Progress Notes (Signed)
24 hour Holter Monitor in progress. 

## 2014-07-05 ENCOUNTER — Telehealth: Payer: Self-pay | Admitting: Cardiovascular Disease

## 2014-07-05 NOTE — Telephone Encounter (Signed)
Wife calling for 24 holter results,just returned yesterday.I explained I would call her as soon as I heard anything  Will forward to DOD

## 2014-07-05 NOTE — Telephone Encounter (Signed)
I was able to track down the Holter monitor, it was read by Dr. Bronson Ing yesterday, but not yet scanned into EPIC for my review. Please let them know that sinus rhythm was present throughout. Lowest heart rate recorded was 41, although importantly there were no pauses or any clear indication that he would benefit from a pacemaker. Occasional ectopy in the form of PACs and PVCs were noted. Otherwise no significant arrhythmias.

## 2014-07-05 NOTE — Telephone Encounter (Signed)
Results of echo / tgs  °

## 2014-07-05 NOTE — Telephone Encounter (Signed)
Spoke with wife,results given

## 2014-11-28 ENCOUNTER — Other Ambulatory Visit (HOSPITAL_COMMUNITY): Payer: Self-pay | Admitting: Family Medicine

## 2014-11-28 DIAGNOSIS — E049 Nontoxic goiter, unspecified: Secondary | ICD-10-CM

## 2014-11-28 DIAGNOSIS — R1310 Dysphagia, unspecified: Secondary | ICD-10-CM

## 2014-11-30 ENCOUNTER — Ambulatory Visit (HOSPITAL_COMMUNITY)
Admission: RE | Admit: 2014-11-30 | Discharge: 2014-11-30 | Disposition: A | Discharge status: Home / Self Care | Payer: Medicare Other | Source: Ambulatory Visit | Attending: Family Medicine | Admitting: Family Medicine

## 2014-11-30 DIAGNOSIS — E049 Nontoxic goiter, unspecified: Secondary | ICD-10-CM | POA: Diagnosis not present

## 2014-11-30 DIAGNOSIS — Z87891 Personal history of nicotine dependence: Secondary | ICD-10-CM | POA: Diagnosis not present

## 2014-11-30 DIAGNOSIS — R1310 Dysphagia, unspecified: Secondary | ICD-10-CM | POA: Insufficient documentation

## 2014-12-06 ENCOUNTER — Other Ambulatory Visit: Payer: Self-pay | Admitting: Internal Medicine

## 2014-12-06 DIAGNOSIS — E041 Nontoxic single thyroid nodule: Secondary | ICD-10-CM

## 2015-01-09 ENCOUNTER — Ambulatory Visit
Admission: RE | Admit: 2015-01-09 | Discharge: 2015-01-09 | Disposition: A | Discharge status: Home / Self Care | Payer: Medicare Other | Source: Ambulatory Visit | Attending: Internal Medicine | Admitting: Internal Medicine

## 2015-01-09 ENCOUNTER — Other Ambulatory Visit (HOSPITAL_COMMUNITY)
Admission: RE | Admit: 2015-01-09 | Discharge: 2015-01-09 | Disposition: A | Discharge status: Home / Self Care | Payer: Medicare Other | Source: Ambulatory Visit | Attending: Interventional Radiology | Admitting: Interventional Radiology

## 2015-01-09 DIAGNOSIS — E041 Nontoxic single thyroid nodule: Secondary | ICD-10-CM | POA: Diagnosis present

## 2015-04-02 ENCOUNTER — Ambulatory Visit (INDEPENDENT_AMBULATORY_CARE_PROVIDER_SITE_OTHER): Payer: Medicare Other | Admitting: Urology

## 2015-04-02 DIAGNOSIS — N401 Enlarged prostate with lower urinary tract symptoms: Secondary | ICD-10-CM | POA: Diagnosis not present

## 2015-04-02 DIAGNOSIS — C671 Malignant neoplasm of dome of bladder: Secondary | ICD-10-CM

## 2015-06-30 ENCOUNTER — Inpatient Hospital Stay (HOSPITAL_COMMUNITY)
Admission: EM | Admit: 2015-06-30 | Discharge: 2015-07-03 | DRG: 482 | Disposition: A | Discharge status: Skilled Nursing Facility | Payer: Medicare Other | Attending: Internal Medicine | Admitting: Internal Medicine

## 2015-06-30 ENCOUNTER — Emergency Department (HOSPITAL_COMMUNITY): Payer: Medicare Other

## 2015-06-30 ENCOUNTER — Encounter (HOSPITAL_COMMUNITY): Payer: Self-pay | Admitting: Nurse Practitioner

## 2015-06-30 DIAGNOSIS — Z87891 Personal history of nicotine dependence: Secondary | ICD-10-CM

## 2015-06-30 DIAGNOSIS — Z86718 Personal history of other venous thrombosis and embolism: Secondary | ICD-10-CM

## 2015-06-30 DIAGNOSIS — S32431A Displaced fracture of anterior column [iliopubic] of right acetabulum, initial encounter for closed fracture: Secondary | ICD-10-CM | POA: Diagnosis present

## 2015-06-30 DIAGNOSIS — K219 Gastro-esophageal reflux disease without esophagitis: Secondary | ICD-10-CM | POA: Diagnosis present

## 2015-06-30 DIAGNOSIS — G4730 Sleep apnea, unspecified: Secondary | ICD-10-CM | POA: Diagnosis present

## 2015-06-30 DIAGNOSIS — M25551 Pain in right hip: Secondary | ICD-10-CM | POA: Diagnosis present

## 2015-06-30 DIAGNOSIS — Z79899 Other long term (current) drug therapy: Secondary | ICD-10-CM

## 2015-06-30 DIAGNOSIS — W19XXXA Unspecified fall, initial encounter: Secondary | ICD-10-CM | POA: Diagnosis present

## 2015-06-30 DIAGNOSIS — K5909 Other constipation: Secondary | ICD-10-CM | POA: Diagnosis present

## 2015-06-30 DIAGNOSIS — F419 Anxiety disorder, unspecified: Secondary | ICD-10-CM | POA: Diagnosis present

## 2015-06-30 DIAGNOSIS — Z8551 Personal history of malignant neoplasm of bladder: Secondary | ICD-10-CM | POA: Diagnosis not present

## 2015-06-30 DIAGNOSIS — Y92009 Unspecified place in unspecified non-institutional (private) residence as the place of occurrence of the external cause: Secondary | ICD-10-CM

## 2015-06-30 DIAGNOSIS — S72001A Fracture of unspecified part of neck of right femur, initial encounter for closed fracture: Secondary | ICD-10-CM | POA: Diagnosis present

## 2015-06-30 DIAGNOSIS — K5900 Constipation, unspecified: Secondary | ICD-10-CM | POA: Diagnosis not present

## 2015-06-30 DIAGNOSIS — N400 Enlarged prostate without lower urinary tract symptoms: Secondary | ICD-10-CM | POA: Diagnosis present

## 2015-06-30 DIAGNOSIS — Z419 Encounter for procedure for purposes other than remedying health state, unspecified: Secondary | ICD-10-CM

## 2015-06-30 DIAGNOSIS — S72001D Fracture of unspecified part of neck of right femur, subsequent encounter for closed fracture with routine healing: Secondary | ICD-10-CM | POA: Diagnosis not present

## 2015-06-30 DIAGNOSIS — S72001S Fracture of unspecified part of neck of right femur, sequela: Secondary | ICD-10-CM | POA: Diagnosis not present

## 2015-06-30 LAB — CBC WITH DIFFERENTIAL/PLATELET
BASOS ABS: 0 10*3/uL (ref 0.0–0.1)
Basophils Relative: 0 %
Eosinophils Absolute: 0.1 10*3/uL (ref 0.0–0.7)
Eosinophils Relative: 1 %
HEMATOCRIT: 41.5 % (ref 39.0–52.0)
Hemoglobin: 14.1 g/dL (ref 13.0–17.0)
LYMPHS ABS: 1.3 10*3/uL (ref 0.7–4.0)
LYMPHS PCT: 13 %
MCH: 32.6 pg (ref 26.0–34.0)
MCHC: 34 g/dL (ref 30.0–36.0)
MCV: 95.8 fL (ref 78.0–100.0)
MONO ABS: 0.8 10*3/uL (ref 0.1–1.0)
MONOS PCT: 8 %
NEUTROS ABS: 7.4 10*3/uL (ref 1.7–7.7)
Neutrophils Relative %: 78 %
Platelets: 202 10*3/uL (ref 150–400)
RBC: 4.33 MIL/uL (ref 4.22–5.81)
RDW: 12.6 % (ref 11.5–15.5)
WBC: 9.7 10*3/uL (ref 4.0–10.5)

## 2015-06-30 LAB — COMPREHENSIVE METABOLIC PANEL
ALBUMIN: 3.8 g/dL (ref 3.5–5.0)
ALT: 7 U/L — ABNORMAL LOW (ref 17–63)
ANION GAP: 11 (ref 5–15)
AST: 24 U/L (ref 15–41)
Alkaline Phosphatase: 80 U/L (ref 38–126)
BUN: 10 mg/dL (ref 6–20)
CHLORIDE: 105 mmol/L (ref 101–111)
CO2: 21 mmol/L — AB (ref 22–32)
Calcium: 9.3 mg/dL (ref 8.9–10.3)
Creatinine, Ser: 0.8 mg/dL (ref 0.61–1.24)
GFR calc Af Amer: >60 mL/min (ref >60)
GFR calc non Af Amer: >60 mL/min (ref >60)
GLUCOSE: 127 mg/dL — AB (ref 65–99)
POTASSIUM: 4 mmol/L (ref 3.5–5.1)
SODIUM: 137 mmol/L (ref 135–145)
TOTAL PROTEIN: 6.2 g/dL — AB (ref 6.5–8.1)
Total Bilirubin: 1 mg/dL (ref 0.3–1.2)

## 2015-06-30 LAB — URINALYSIS, ROUTINE W REFLEX MICROSCOPIC
Bilirubin Urine: NEGATIVE
Glucose, UA: NEGATIVE mg/dL
Hgb urine dipstick: NEGATIVE
KETONES UR: NEGATIVE mg/dL
LEUKOCYTES UA: NEGATIVE
Nitrite: NEGATIVE
PH: 5.5 (ref 5.0–8.0)
Protein, ur: NEGATIVE mg/dL
Specific Gravity, Urine: 1.019 (ref 1.005–1.030)

## 2015-06-30 MED ORDER — SODIUM CHLORIDE 0.9 % IV BOLUS (SEPSIS)
1000.0000 mL | Freq: Once | INTRAVENOUS | Status: AC
  Administered 2015-06-30: 1000 mL via INTRAVENOUS

## 2015-06-30 MED ORDER — FENTANYL CITRATE (PF) 100 MCG/2ML IJ SOLN
50.0000 ug | Freq: Once | INTRAMUSCULAR | Status: AC
  Administered 2015-06-30: 50 ug via INTRAVENOUS
  Filled 2015-06-30: qty 2

## 2015-06-30 MED ORDER — HYDROMORPHONE HCL 1 MG/ML IJ SOLN
0.5000 mg | INTRAMUSCULAR | Status: DC | PRN
  Administered 2015-06-30 – 2015-07-02 (×2): 1 mg via INTRAVENOUS
  Filled 2015-06-30 (×2): qty 1

## 2015-06-30 MED ORDER — ONDANSETRON HCL 4 MG PO TABS: 4.0000 mg | ORAL_TABLET | Freq: Four times a day (QID) | ORAL | Status: DC | PRN

## 2015-06-30 MED ORDER — ONDANSETRON HCL 4 MG/2ML IJ SOLN
4.0000 mg | Freq: Four times a day (QID) | INTRAMUSCULAR | Status: DC | PRN
  Administered 2015-06-30: 4 mg via INTRAVENOUS
  Filled 2015-06-30: qty 2

## 2015-06-30 NOTE — Consult Note (Signed)
Reason for Consult:  Right hip fracture Referring Physician:   Mesner, MD/EDP  [name] is an 82 y.o. male.  HPI:   82 yo male who sustained an accidental fall at home.  Landed on his right hip and was unable to ambulate.  He reports severe right hip pain.  Transported to the ED and found to have a high intertroch/low femoral neck fracture of the right hip.  He denies any other injuries.  His son is at the bedside.  Past Medical History  Diagnosis Date  . Anxiety   . Bladder cancer ([place]) 2008  . Arthritis     Hands and ankles  . History of DVT (deep vein thrombosis) 2008  . BPH (benign prostatic hyperplasia)   . GERD (gastroesophageal reflux disease)   . Sleep apnea     Stop Bang score of 5    Past Surgical History  Procedure Laterality Date  . Prostate surgery  2004  . Bladder surgery  2008  . Inguinal hernia repair Left 09/05/2012    Procedure: HERNIA REPAIR INGUINAL ADULT;  Surgeon: Jamesetta So, MD;  Location: [facility];  Service: General;  Laterality: Left;  Left Inguinal Herniorraphy  . Insertion of mesh Left 09/05/2012    Procedure: INSERTION OF MESH;  Surgeon: Jamesetta So, MD;  Location: [facility];  Service: General;  Laterality: Left;  . Transurethral resection of prostate N/A 05/16/2013    Procedure: TRANSURETHRAL RESECTION OF THE PROSTATE (TURP);  Surgeon: Marissa Nestle, MD;  Location: [facility];  Service: Urology;  Laterality: N/A;    History reviewed. No pertinent family history.  Social History:  reports that he quit smoking about 36 years ago. His smoking use included Cigarettes. He quit after 39 years of use. He does not have any smokeless tobacco history on file. He reports that he does not drink alcohol or use illicit drugs.  Allergies:  Allergies  Allergen Reactions  . Cephalosporins     unknown  . Lovenox [Enoxaparin]     Severe nausea and vomiting   . Penicillins Swelling    Has patient had a PCN reaction causing immediate rash,  facial/tongue/throat swelling, SOB or lightheadedness with hypotension: unknown Has patient had a PCN reaction causing severe rash involving mucus membranes or skin necrosis: unknown Has patient had a PCN reaction that required hospitalization : unknown Has patient had a PCN reaction occurring within the last 10 years: unknown If all of the above answers are "NO", then may proceed with Cephalosporin use.   . Sulfa Antibiotics Hives and Rash    Medications: I have reviewed the patient's current medications.  Results for orders placed or performed during the hospital encounter of 06/30/15 (from the past 48 hour(s))  Comprehensive metabolic panel     Status: Abnormal   Collection Time: 06/30/15  7:10 PM  Result Value Ref Range   Sodium 137 135 - 145 mmol/L   Potassium 4.0 3.5 - 5.1 mmol/L   Chloride 105 101 - 111 mmol/L   CO2 21 (L) 22 - 32 mmol/L   Glucose, Bld 127 (H) 65 - 99 mg/dL   BUN 10 6 - 20 mg/dL   Creatinine, Ser 0.80 0.61 - 1.24 mg/dL   Calcium 9.3 8.9 - 10.3 mg/dL   Total Protein 6.2 (L) 6.5 - 8.1 g/dL   Albumin 3.8 3.5 - 5.0 g/dL   AST 24 15 - 41 U/L   ALT 7 (L) 17 - 63 U/L   Alkaline Phosphatase  80 38 - 126 U/L   Total Bilirubin 1.0 0.3 - 1.2 mg/dL   GFR calc non Af Amer >60 >60 mL/min   GFR calc Af Amer >60 >60 mL/min    Comment: (NOTE) The eGFR has been calculated using the CKD EPI equation. This calculation has not been validated in all clinical situations. eGFR's persistently <60 mL/min signify possible Chronic Kidney Disease.    Anion gap 11 5 - 15  CBC with Differential     Status: None   Collection Time: 06/30/15  7:10 PM  Result Value Ref Range   WBC 9.7 4.0 - 10.5 K/uL   RBC 4.33 4.22 - 5.81 MIL/uL   Hemoglobin 14.1 13.0 - 17.0 g/dL   HCT 41.5 39.0 - 52.0 %   MCV 95.8 78.0 - 100.0 fL   MCH 32.6 26.0 - 34.0 pg   MCHC 34.0 30.0 - 36.0 g/dL   RDW 12.6 11.5 - 15.5 %   Platelets 202 150 - 400 K/uL   Neutrophils Relative % 78 %   Neutro Abs 7.4 1.7 -  7.7 K/uL   Lymphocytes Relative 13 %   Lymphs Abs 1.3 0.7 - 4.0 K/uL   Monocytes Relative 8 %   Monocytes Absolute 0.8 0.1 - 1.0 K/uL   Eosinophils Relative 1 %   Eosinophils Absolute 0.1 0.0 - 0.7 K/uL   Basophils Relative 0 %   Basophils Absolute 0.0 0.0 - 0.1 K/uL    Dg Hip Unilat With Pelvis 2-3 Views Right  06/30/2015  CLINICAL DATA:  Fall while walking with a walker, now with right hip pain. EXAM: DG HIP (WITH OR WITHOUT PELVIS) 2-3V RIGHT COMPARISON:  None. FINDINGS: Displaced angulated fracture of the right femoral neck with mild proximal shortening of the femoral shaft. Femoral head remains located. No additional fracture of the bony pelvis. Vascular calcifications are seen. Surgical clips project over the left inguinal region. IMPRESSION: Displaced angulated right femoral neck fracture. Electronically Signed   By: Jeb Levering M.D.   On: 06/30/2015 20:02   Dg Femur, Min 2 Views Right  06/30/2015  CLINICAL DATA:  Right leg pain after fall while walking with a walker. EXAM: RIGHT FEMUR 2 VIEWS COMPARISON:  None. FINDINGS: Angulated femoral neck fracture with proximal migration of the femoral shaft. Distal femur is intact. No additional acute fracture. Knee alignment is grossly maintained. IMPRESSION: Femoral neck fracture.  Distal femur is intact. Electronically Signed   By: Jeb Levering M.D.   On: 06/30/2015 20:06    ROS Blood pressure 127/60, pulse 60, temperature 98.8 F (37.1 C), temperature source Oral, resp. rate 18, SpO2 98 %. Physical Exam  Constitutional: He is oriented to person, place, and time. He appears well-developed and well-nourished.  HENT:  Head: Normocephalic and atraumatic.  Eyes: Pupils are equal, round, and reactive to light.  Neck: Normal range of motion.  Cardiovascular: Normal rate.   Respiratory: Effort normal and breath sounds normal.  GI: Soft. Bowel sounds are normal.  Musculoskeletal:       Right hip: He exhibits decreased strength,  tenderness, bony tenderness and deformity.  Neurological: He is alert and oriented to person, place, and time.  Skin: Skin is warm and dry.  Psychiatric: He has a normal mood and affect.   His right leg is shortened and externally rotated.   Assessment/Plan: Right hip with displaced proximal femur fracture 1)  This will certainly need an operative intervention given the nature of this fracture and the patient's severe pain  and the inability to ambulate.  Due to OR availability, will have him transferred to [facility] for Surgery tomorrow late in the day (1/9).  He can eat tonight.  Risks and benefits of surgery have been discussed in detail with his son.  [facility] is graciously admitting for medical management and clearance.  Cova Knieriem Y 06/30/2015, 9:09 PM

## 2015-06-30 NOTE — ED Provider Notes (Signed)
CSN: [id number]     Arrival date & time 06/30/15  1810 History   First MD Initiated Contact with Patient 06/30/15 1833     No chief complaint on file.    (Consider location/radiation/quality/duration/timing/severity/associated sxs/prior Treatment) Patient is a 82 y.o. male presenting with fall.  Fall This is a new problem. The current episode started 1 to 2 hours ago. The problem occurs constantly. The problem has not changed since onset.Pertinent negatives include no chest pain and no shortness of breath. Nothing aggravates the symptoms. Nothing relieves the symptoms. He has tried nothing for the symptoms. The treatment provided no relief.    Past Medical History  Diagnosis Date  . Anxiety   . Bladder cancer 2008  . Arthritis     Hands and ankles  . History of DVT (deep vein thrombosis) 2008  . BPH (benign prostatic hyperplasia)   . GERD (gastroesophageal reflux disease)   . Sleep apnea     Stop Bang score of 5   Past Surgical History  Procedure Laterality Date  . Prostate surgery  2004  . Bladder surgery  2008  . Inguinal hernia repair Left 09/05/2012    Procedure: HERNIA REPAIR INGUINAL ADULT;  Surgeon: Jamesetta So, MD;  Location: [facility];  Service: General;  Laterality: Left;  Left Inguinal Herniorraphy  . Insertion of mesh Left 09/05/2012    Procedure: INSERTION OF MESH;  Surgeon: Jamesetta So, MD;  Location: [facility];  Service: General;  Laterality: Left;  . Transurethral resection of prostate N/A 05/16/2013    Procedure: TRANSURETHRAL RESECTION OF THE PROSTATE (TURP);  Surgeon: Marissa Nestle, MD;  Location: [facility];  Service: Urology;  Laterality: N/A;   No family history on file. Social History  Substance Use Topics  . Smoking status: Former Smoker -- 39 years    Types: Cigarettes    Quit date: 07/05/1979  . Smokeless tobacco: Not on file  . Alcohol Use: No    Review of Systems  Respiratory: Negative for shortness of breath.   Cardiovascular: Negative for  chest pain.  Musculoskeletal:       Right hip pain  All other systems reviewed and are negative.     Allergies  Cephalosporins; Lovenox; Penicillins; and Sulfa antibiotics  Home Medications   Prior to Admission medications   Medication Sig Start Date End Date Taking? Authorizing Provider  acetaminophen (TYLENOL) 500 MG tablet Take 500 mg by mouth every 6 (six) hours as needed for pain.    Historical Provider, MD  ALPRAZolam Duanne Moron) 0.25 MG tablet Take 0.25 mg by mouth at bedtime as needed for anxiety.    Historical Provider, MD  bisacodyl (DULCOLAX) 5 MG EC tablet Take 5 mg by mouth daily as needed for moderate constipation.     Historical Provider, MD  diphenhydrAMINE (BENADRYL) 25 MG tablet Take 25 mg by mouth every 6 (six) hours as needed for allergies.    Historical Provider, MD  fluticasone (FLONASE) 50 MCG/ACT nasal spray Place 2 sprays into both nostrils as needed for allergies or rhinitis.    Historical Provider, MD  tamsulosin (FLOMAX) 0.4 MG CAPS Take 0.4 mg by mouth daily.    Historical Provider, MD   BP 148/74 mmHg  Pulse 71  Temp(Src) 98.8 F (37.1 C) (Oral)  Resp 18  SpO2 100% Physical Exam  Constitutional: He is oriented to person, place, and time. He appears well-developed and well-nourished.  HENT:  Head: Normocephalic and atraumatic.  Neck: Normal range of motion.  Cardiovascular: Normal rate.   Pulmonary/Chest: Effort normal and breath sounds normal. No respiratory distress.  Abdominal: Soft. He exhibits no distension. There is no tenderness.  Musculoskeletal: Normal range of motion. He exhibits tenderness (palpation of right femur). He exhibits no edema.  Right leg shortened and externally rotatecd  Neurological: He is alert and oriented to person, place, and time. No cranial nerve deficit. Coordination normal.  Skin: Skin is warm and dry. No rash noted.  Nursing note and vitals reviewed.   ED Course  Procedures (including critical care time) Labs  Review Labs Reviewed - No data to display  Imaging Review No results found. I have personally reviewed and evaluated these images and lab results as part of my medical decision-making.   EKG Interpretation None      MDM   Diagnoses:  1. Right closed hip fracture.   likekly r hip fracture.   R hip fracture confirmed on XR. No other obvious injuries, d/w ortho (Dr. Ninfa Linden) and will admit to medicine, transfer to [facility] because of case load.     Merrily Pew, MD 07/03/15 1321

## 2015-06-30 NOTE — H&P (Signed)
[facility] Admission History and Physical       AQUILES ABRESCH [id number] DOB: [birth year] DOA: 06/30/2015  Referring physician: EDP PCP: Glo Herring., MD  Specialists:   Chief Complaint: Right Hip Pain after Fall  HPI: [name] is a 82 y.o. male with History of Bladder Ca, BPH, GERD who presents to the ED after falling backward onto his right hip when he was coming out of the bathroom.   He had increased pain in his right hip and was found to have a closed Right Femoral Neck Fracture on X-ray .  Orthopedics DR Ninfa Linden was consulted and requested for patient to be transferred to [facility] for surgical repair tomorrow.     Review of Systems:  Constitutional: No Weight Loss, No Weight Gain, Night Sweats, Fevers, Chills, Dizziness, Light Headedness, Fatigue, or Generalized Weakness HEENT: No Headaches, Difficulty Swallowing,Tooth/Dental Problems,Sore Throat,  No Sneezing, Rhinitis, Ear Ache, Nasal Congestion, or Post Nasal Drip,  Cardio-vascular:  No Chest pain, Orthopnea, PND, Edema in Lower Extremities, Anasarca, Dizziness, Palpitations  Resp: No Dyspnea, No DOE, No Productive Cough, No Non-Productive Cough, No Hemoptysis, No Wheezing.    GI: No Heartburn, Indigestion, Abdominal Pain, Nausea, Vomiting, Diarrhea, Constipation, Hematemesis, Hematochezia, Melena, Change in Bowel Habits,  Loss of Appetite  GU: No Dysuria, No Change in Color of Urine, No Urgency or Urinary Frequency, No Flank pain.  Musculoskeletal: No Joint Pain or Swelling, No Decreased Range of Motion, No Back Pain.  Neurologic: No Syncope, No Seizures, Muscle Weakness, Paresthesia, Vision Disturbance or Loss, No Diplopia, No Vertigo, No Difficulty Walking,  Skin: No Rash or Lesions. Psych: No Change in Mood or Affect, No Depression or Anxiety, No Memory loss, No Confusion, or Hallucinations   Past Medical History  Diagnosis Date  . Anxiety   . Bladder cancer ([place]) 2008  . Arthritis    Hands and ankles  . History of DVT (deep vein thrombosis) 2008  . BPH (benign prostatic hyperplasia)   . GERD (gastroesophageal reflux disease)   . Sleep apnea     Stop Bang score of 5     Past Surgical History  Procedure Laterality Date  . Prostate surgery  2004  . Bladder surgery  2008  . Inguinal hernia repair Left 09/05/2012    Procedure: HERNIA REPAIR INGUINAL ADULT;  Surgeon: Jamesetta So, MD;  Location: [facility];  Service: General;  Laterality: Left;  Left Inguinal Herniorraphy  . Insertion of mesh Left 09/05/2012    Procedure: INSERTION OF MESH;  Surgeon: Jamesetta So, MD;  Location: [facility];  Service: General;  Laterality: Left;  . Transurethral resection of prostate N/A 05/16/2013    Procedure: TRANSURETHRAL RESECTION OF THE PROSTATE (TURP);  Surgeon: Marissa Nestle, MD;  Location: [facility];  Service: Urology;  Laterality: N/A;      Prior to Admission medications   Medication Sig Start Date End Date Taking? Authorizing Provider  acetaminophen (TYLENOL) 500 MG tablet Take 500 mg by mouth every 6 (six) hours as needed for pain.   Yes Historical Provider, MD  ALPRAZolam Duanne Moron) 0.5 MG tablet Take 0.5 mg by mouth 3 (three) times daily as needed for anxiety or sleep.   Yes Historical Provider, MD  azithromycin (ZITHROMAX) 250 MG tablet Take 250 mg by mouth daily.   Yes Historical Provider, MD  bisacodyl (DULCOLAX) 5 MG EC tablet Take 5 mg by mouth daily as needed for moderate constipation.    Yes Historical Provider, MD  diphenhydrAMINE (BENADRYL)  25 MG tablet Take 25 mg by mouth every 6 (six) hours as needed for allergies.   Yes Historical Provider, MD  fluticasone (FLONASE) 50 MCG/ACT nasal spray Place 2 sprays into both nostrils as needed for allergies or rhinitis.   Yes Historical Provider, MD  tamsulosin (FLOMAX) 0.4 MG CAPS Take 0.4 mg by mouth daily.    Historical Provider, MD     Allergies  Allergen Reactions  . Cephalosporins     unknown  . Lovenox [Enoxaparin]      Severe nausea and vomiting   . Penicillins Swelling    Has patient had a PCN reaction causing immediate rash, facial/tongue/throat swelling, SOB or lightheadedness with hypotension: unknown Has patient had a PCN reaction causing severe rash involving mucus membranes or skin necrosis: unknown Has patient had a PCN reaction that required hospitalization : unknown Has patient had a PCN reaction occurring within the last 10 years: unknown If all of the above answers are "NO", then may proceed with Cephalosporin use.   . Sulfa Antibiotics Hives and Rash    Social History:  reports that he quit smoking about 36 years ago. His smoking use included Cigarettes. He quit after 39 years of use. He does not have any smokeless tobacco history on file. He reports that he does not drink alcohol or use illicit drugs.      History reviewed. No pertinent family history.     Physical Exam:  GEN:  Pleasant Thin Elderly  82 y.o. Caucasian male examined and in no acute distress; cooperative with exam Filed Vitals:   06/30/15 1823 06/30/15 2000  BP: 148/74 127/60  Pulse: 71 60  Temp: 98.8 F (37.1 C)   TempSrc: Oral   Resp: 18 18  SpO2: 100% 98%   Blood pressure 127/60, pulse 60, temperature 98.8 F (37.1 C), temperature source Oral, resp. rate 18, SpO2 98 %. PSYCH: He is alert and oriented x4; does not appear anxious does not appear depressed; affect is normal HEENT: Normocephalic and Atraumatic, Mucous membranes pink; PERRLA; EOM intact; Fundi:  Benign;  No scleral icterus, Nares: Patent, Oropharynx: Clear, Fair Dentition,    Neck:  FROM, No Cervical Lymphadenopathy nor Thyromegaly or Carotid Bruit; No JVD; Breasts:: Not examined CHEST WALL: No tenderness CHEST: Normal respiration, clear to auscultation bilaterally HEART: Regular rate and rhythm; no murmurs rubs or gallops BACK: No kyphosis or scoliosis; No CVA tenderness ABDOMEN: Positive Bowel Sounds, Soft Non-Tender, No Rebound or Guarding; No  Masses, No Organomegaly. Rectal Exam: Not done EXTREMITIES: No Cyanosis, Clubbing, or Edema; No Ulcerations. Genitalia: not examined PULSES: 2+ and symmetric SKIN: Normal hydration no rash or ulceration CNS:  Alert and Oriented x 4, No Focal Deficits Vascular: pulses palpable throughout    Labs on Admission:  Basic Metabolic Panel:  Recent Labs Lab 06/30/15 1910  NA 137  K 4.0  CL 105  CO2 21*  GLUCOSE 127*  BUN 10  CREATININE 0.80  CALCIUM 9.3   Liver Function Tests:  Recent Labs Lab 06/30/15 1910  AST 24  ALT 7*  ALKPHOS 80  BILITOT 1.0  PROT 6.2*  ALBUMIN 3.8   No results for input(s): LIPASE, AMYLASE in the last 168 hours. No results for input(s): AMMONIA in the last 168 hours. CBC:  Recent Labs Lab 06/30/15 1910  WBC 9.7  NEUTROABS 7.4  HGB 14.1  HCT 41.5  MCV 95.8  PLT 202   Cardiac Enzymes: No results for input(s): CKTOTAL, CKMB, CKMBINDEX, TROPONINI in the last 168  hours.  BNP (last 3 results) No results for input(s): BNP in the last 8760 hours.  ProBNP (last 3 results) No results for input(s): PROBNP in the last 8760 hours.  CBG: No results for input(s): GLUCAP in the last 168 hours.  Radiological Exams on Admission: Dg Hip Unilat With Pelvis 2-3 Views Right  06/30/2015  CLINICAL DATA:  Fall while walking with a walker, now with right hip pain. EXAM: DG HIP (WITH OR WITHOUT PELVIS) 2-3V RIGHT COMPARISON:  None. FINDINGS: Displaced angulated fracture of the right femoral neck with mild proximal shortening of the femoral shaft. Femoral head remains located. No additional fracture of the bony pelvis. Vascular calcifications are seen. Surgical clips project over the left inguinal region. IMPRESSION: Displaced angulated right femoral neck fracture. Electronically Signed   By: Jeb Levering M.D.   On: 06/30/2015 20:02   Dg Femur, Min 2 Views Right  06/30/2015  CLINICAL DATA:  Right leg pain after fall while walking with a walker. EXAM: RIGHT  FEMUR 2 VIEWS COMPARISON:  None. FINDINGS: Angulated femoral neck fracture with proximal migration of the femoral shaft. Distal femur is intact. No additional acute fracture. Knee alignment is grossly maintained. IMPRESSION: Femoral neck fracture.  Distal femur is intact. Electronically Signed   By: Jeb Levering M.D.   On: 06/30/2015 20:06     EKG: Independently reviewed. Normal sinus Rhythm Rate = 62   Assessment/Plan:       82 y.o. male with  Principal Problem:   1.      Closed right hip fracture (HCC)   Ortho: Dr Ninfa Linden   NPO after Midnight   Pain Control with PRN IV Dilaudid   Bedrest     Active Problems:   2.     Fall   Fall Precautions       3.    GERD (gastroesophageal reflux disease)   On PPI rx     4.    BPH (benign prostatic hyperplasia)   On Tamsulosin Rx     5.    DVT Prophylaxis   SCDS    Code Status:     FULL CODE      Family Communication:   Son at Bedside      Disposition Plan:    Inpatient Status        Time spent:  [facility] Pager [phone]   If [place] Please Contact the Day Rounding Team MD for [facility]  If 7PM-7AM, Please Contact Night-Floor Coverage  [email or web address] Password [email or web address] 06/30/2015, 8:58 PM     ADDENDUM:   Patient was seen and examined on 06/30/2015

## 2015-06-30 NOTE — ED Notes (Signed)
Pt presents with complains of right hip pain, reports fall at home while using his walker. Obvious deformity, shortening and external roatation, 2+ pedal pulses palpated, no neuromuscular compromise noted, pt reports pain "only when he moves."

## 2015-06-30 NOTE — ED Notes (Signed)
Bed: WA01 Expected date:  Expected time:  Means of arrival:  Comments: Fall, hip fx

## 2015-07-01 ENCOUNTER — Inpatient Hospital Stay (HOSPITAL_COMMUNITY): Payer: Medicare Other | Admitting: Certified Registered Nurse Anesthetist

## 2015-07-01 ENCOUNTER — Encounter (HOSPITAL_COMMUNITY)
Admission: EM | Disposition: A | Discharge status: Skilled Nursing Facility | Payer: Self-pay | Source: Home / Self Care | Attending: Internal Medicine

## 2015-07-01 ENCOUNTER — Inpatient Hospital Stay (HOSPITAL_COMMUNITY): Payer: Medicare Other

## 2015-07-01 ENCOUNTER — Encounter (HOSPITAL_COMMUNITY): Payer: Self-pay | Admitting: Certified Registered"

## 2015-07-01 DIAGNOSIS — N400 Enlarged prostate without lower urinary tract symptoms: Secondary | ICD-10-CM | POA: Diagnosis present

## 2015-07-01 DIAGNOSIS — K219 Gastro-esophageal reflux disease without esophagitis: Secondary | ICD-10-CM | POA: Diagnosis present

## 2015-07-01 DIAGNOSIS — W19XXXA Unspecified fall, initial encounter: Secondary | ICD-10-CM | POA: Diagnosis present

## 2015-07-01 HISTORY — PX: FEMUR IM NAIL: SHX1597

## 2015-07-01 LAB — CBC
HCT: 37.6 % — ABNORMAL LOW (ref 39.0–52.0)
HEMOGLOBIN: 12.4 g/dL — AB (ref 13.0–17.0)
MCH: 32 pg (ref 26.0–34.0)
MCHC: 33 g/dL (ref 30.0–36.0)
MCV: 96.9 fL (ref 78.0–100.0)
PLATELETS: 178 10*3/uL (ref 150–400)
RBC: 3.88 MIL/uL — AB (ref 4.22–5.81)
RDW: 12.7 % (ref 11.5–15.5)
WBC: 9.9 10*3/uL (ref 4.0–10.5)

## 2015-07-01 LAB — BASIC METABOLIC PANEL
ANION GAP: 9 (ref 5–15)
BUN: 10 mg/dL (ref 6–20)
CALCIUM: 8.5 mg/dL — AB (ref 8.9–10.3)
CO2: 23 mmol/L (ref 22–32)
Chloride: 105 mmol/L (ref 101–111)
Creatinine, Ser: 0.75 mg/dL (ref 0.61–1.24)
Glucose, Bld: 131 mg/dL — ABNORMAL HIGH (ref 65–99)
Potassium: 4.2 mmol/L (ref 3.5–5.1)
Sodium: 137 mmol/L (ref 135–145)

## 2015-07-01 LAB — SURGICAL PCR SCREEN
MRSA, PCR: NEGATIVE
STAPHYLOCOCCUS AUREUS: NEGATIVE

## 2015-07-01 SURGERY — INSERTION, INTRAMEDULLARY ROD, FEMUR
Anesthesia: General | Site: Hip | Laterality: Right

## 2015-07-01 MED ORDER — METOCLOPRAMIDE HCL 5 MG PO TABS: 5.0000 mg | ORAL_TABLET | Freq: Three times a day (TID) | ORAL | Status: DC | PRN

## 2015-07-01 MED ORDER — SODIUM CHLORIDE 0.9 % IJ SOLN
INTRAMUSCULAR | Status: AC
  Filled 2015-07-01: qty 10

## 2015-07-01 MED ORDER — FENTANYL CITRATE (PF) 250 MCG/5ML IJ SOLN
INTRAMUSCULAR | Status: AC
  Filled 2015-07-01: qty 5

## 2015-07-01 MED ORDER — CLINDAMYCIN PHOSPHATE 900 MG/50ML IV SOLN
INTRAVENOUS | Status: AC
  Filled 2015-07-01: qty 50

## 2015-07-01 MED ORDER — FENTANYL CITRATE (PF) 100 MCG/2ML IJ SOLN: 25.0000 ug | INTRAMUSCULAR | Status: DC | PRN

## 2015-07-01 MED ORDER — LACTATED RINGERS IV SOLN
INTRAVENOUS | Status: DC
  Administered 2015-07-01 (×2): via INTRAVENOUS

## 2015-07-01 MED ORDER — ARTIFICIAL TEARS OP OINT
TOPICAL_OINTMENT | OPHTHALMIC | Status: AC
Start: 2015-07-01 — End: 2015-07-01
  Filled 2015-07-01: qty 3.5

## 2015-07-01 MED ORDER — ACETAMINOPHEN 650 MG RE SUPP: 650.0000 mg | Freq: Four times a day (QID) | RECTAL | Status: DC | PRN

## 2015-07-01 MED ORDER — METHOCARBAMOL 500 MG PO TABS
500.0000 mg | ORAL_TABLET | Freq: Four times a day (QID) | ORAL | Status: DC | PRN
  Administered 2015-07-02 – 2015-07-03 (×2): 500 mg via ORAL
  Filled 2015-07-01 (×2): qty 1

## 2015-07-01 MED ORDER — ROCURONIUM BROMIDE 50 MG/5ML IV SOLN
INTRAVENOUS | Status: AC
  Filled 2015-07-01: qty 1

## 2015-07-01 MED ORDER — LIDOCAINE HCL (CARDIAC) 20 MG/ML IV SOLN
INTRAVENOUS | Status: AC
  Filled 2015-07-01: qty 5

## 2015-07-01 MED ORDER — SODIUM CHLORIDE 0.9 % IV SOLN
INTRAVENOUS | Status: DC
  Administered 2015-07-01: 11:00:00 via INTRAVENOUS
  Administered 2015-07-01: 1 mL via INTRAVENOUS

## 2015-07-01 MED ORDER — HYDROCODONE-ACETAMINOPHEN 5-325 MG PO TABS
1.0000 | ORAL_TABLET | Freq: Four times a day (QID) | ORAL | Status: DC | PRN
  Administered 2015-07-02 – 2015-07-03 (×4): 2 via ORAL
  Filled 2015-07-01 (×4): qty 2

## 2015-07-01 MED ORDER — ONDANSETRON HCL 4 MG/2ML IJ SOLN: 4.0000 mg | Freq: Four times a day (QID) | INTRAMUSCULAR | Status: DC | PRN

## 2015-07-01 MED ORDER — CLINDAMYCIN PHOSPHATE 900 MG/50ML IV SOLN
INTRAVENOUS | Status: DC | PRN
  Administered 2015-07-01: 900 mg via INTRAVENOUS

## 2015-07-01 MED ORDER — METOCLOPRAMIDE HCL 5 MG/ML IJ SOLN: 5.0000 mg | Freq: Three times a day (TID) | INTRAMUSCULAR | Status: DC | PRN

## 2015-07-01 MED ORDER — ALPRAZOLAM 0.5 MG PO TABS
0.5000 mg | ORAL_TABLET | Freq: Three times a day (TID) | ORAL | Status: DC | PRN
  Administered 2015-07-01 – 2015-07-03 (×3): 0.5 mg via ORAL
  Filled 2015-07-01 (×3): qty 1

## 2015-07-01 MED ORDER — ONDANSETRON HCL 4 MG/2ML IJ SOLN
INTRAMUSCULAR | Status: DC | PRN
  Administered 2015-07-01: 4 mg via INTRAVENOUS

## 2015-07-01 MED ORDER — PROMETHAZINE HCL 25 MG/ML IJ SOLN: 6.2500 mg | INTRAMUSCULAR | Status: DC | PRN

## 2015-07-01 MED ORDER — ACETAMINOPHEN 325 MG PO TABS: 650.0000 mg | ORAL_TABLET | Freq: Four times a day (QID) | ORAL | Status: DC | PRN

## 2015-07-01 MED ORDER — EPHEDRINE SULFATE 50 MG/ML IJ SOLN
INTRAMUSCULAR | Status: AC
Start: 2015-07-01 — End: 2015-07-01
  Filled 2015-07-01: qty 1

## 2015-07-01 MED ORDER — TAMSULOSIN HCL 0.4 MG PO CAPS
0.4000 mg | ORAL_CAPSULE | Freq: Every day | ORAL | Status: DC
  Administered 2015-07-01 – 2015-07-03 (×3): 0.4 mg via ORAL
  Filled 2015-07-01 (×3): qty 1

## 2015-07-01 MED ORDER — ALUM & MAG HYDROXIDE-SIMETH 200-200-20 MG/5ML PO SUSP: 30.0000 mL | Freq: Four times a day (QID) | ORAL | Status: DC | PRN

## 2015-07-01 MED ORDER — MORPHINE SULFATE (PF) 2 MG/ML IV SOLN: 0.5000 mg | INTRAVENOUS | Status: DC | PRN

## 2015-07-01 MED ORDER — PHENYLEPHRINE HCL 10 MG/ML IJ SOLN
INTRAMUSCULAR | Status: DC | PRN
  Administered 2015-07-01 (×4): 80 ug via INTRAVENOUS

## 2015-07-01 MED ORDER — ASPIRIN EC 325 MG PO TBEC
325.0000 mg | DELAYED_RELEASE_TABLET | Freq: Every day | ORAL | Status: DC
  Administered 2015-07-02 – 2015-07-03 (×2): 325 mg via ORAL
  Filled 2015-07-01 (×2): qty 1

## 2015-07-01 MED ORDER — CLINDAMYCIN PHOSPHATE 600 MG/50ML IV SOLN
600.0000 mg | Freq: Four times a day (QID) | INTRAVENOUS | Status: AC
  Administered 2015-07-02 (×2): 600 mg via INTRAVENOUS
  Filled 2015-07-01 (×2): qty 50

## 2015-07-01 MED ORDER — MENTHOL 3 MG MT LOZG: 1.0000 | LOZENGE | OROMUCOSAL | Status: DC | PRN

## 2015-07-01 MED ORDER — SUCCINYLCHOLINE CHLORIDE 20 MG/ML IJ SOLN
INTRAMUSCULAR | Status: AC
  Filled 2015-07-01: qty 1

## 2015-07-01 MED ORDER — OXYCODONE HCL 5 MG PO TABS: 5.0000 mg | ORAL_TABLET | ORAL | Status: DC | PRN

## 2015-07-01 MED ORDER — PANTOPRAZOLE SODIUM 40 MG IV SOLR
40.0000 mg | INTRAVENOUS | Status: DC
  Administered 2015-07-01: 40 mg via INTRAVENOUS
  Filled 2015-07-01: qty 40

## 2015-07-01 MED ORDER — SODIUM CHLORIDE 0.9 % IV SOLN
INTRAVENOUS | Status: DC
Start: 2015-07-01 — End: 2015-07-03
  Administered 2015-07-01 – 2015-07-03 (×2): via INTRAVENOUS

## 2015-07-01 MED ORDER — CEFAZOLIN SODIUM-DEXTROSE 2-3 GM-% IV SOLR
INTRAVENOUS | Status: AC
  Filled 2015-07-01: qty 50

## 2015-07-01 MED ORDER — FENTANYL CITRATE (PF) 100 MCG/2ML IJ SOLN
INTRAMUSCULAR | Status: DC | PRN
  Administered 2015-07-01 (×2): 50 ug via INTRAVENOUS

## 2015-07-01 MED ORDER — ACETAMINOPHEN 650 MG RE SUPP
650.0000 mg | Freq: Four times a day (QID) | RECTAL | Status: DC | PRN
Start: 2015-07-01 — End: 2015-07-01

## 2015-07-01 MED ORDER — ONDANSETRON HCL 4 MG PO TABS: 4.0000 mg | ORAL_TABLET | Freq: Four times a day (QID) | ORAL | Status: DC | PRN

## 2015-07-01 MED ORDER — EPHEDRINE SULFATE 50 MG/ML IJ SOLN
INTRAMUSCULAR | Status: DC | PRN
  Administered 2015-07-01 (×2): 10 mg via INTRAVENOUS

## 2015-07-01 MED ORDER — PHENOL 1.4 % MT LIQD: 1.0000 | OROMUCOSAL | Status: DC | PRN

## 2015-07-01 MED ORDER — SUCCINYLCHOLINE CHLORIDE 20 MG/ML IJ SOLN
INTRAMUSCULAR | Status: DC | PRN
  Administered 2015-07-01: 80 mg via INTRAVENOUS

## 2015-07-01 MED ORDER — METHOCARBAMOL 1000 MG/10ML IJ SOLN: 500.0000 mg | Freq: Four times a day (QID) | INTRAMUSCULAR | Status: DC | PRN

## 2015-07-01 MED ORDER — PROPOFOL 10 MG/ML IV BOLUS
INTRAVENOUS | Status: AC
  Filled 2015-07-01: qty 20

## 2015-07-01 MED ORDER — LIDOCAINE HCL (CARDIAC) 20 MG/ML IV SOLN
INTRAVENOUS | Status: DC | PRN
  Administered 2015-07-01: 60 mg via INTRAVENOUS

## 2015-07-01 MED ORDER — PROPOFOL 10 MG/ML IV BOLUS
INTRAVENOUS | Status: DC | PRN
  Administered 2015-07-01: 50 mg via INTRAVENOUS

## 2015-07-01 SURGICAL SUPPLY — 38 items
BNDG COHESIVE 4X5 TAN NS LF (GAUZE/BANDAGES/DRESSINGS) ×3 IMPLANT
COVER PERINEAL POST (MISCELLANEOUS) ×3 IMPLANT
COVER SURGICAL LIGHT HANDLE (MISCELLANEOUS) ×3 IMPLANT
DRAPE STERI IOBAN 125X83 (DRAPES) ×3 IMPLANT
DRSG MEPILEX BORDER 4X4 (GAUZE/BANDAGES/DRESSINGS) ×4 IMPLANT
DURAPREP 26ML APPLICATOR (WOUND CARE) ×3 IMPLANT
ELECT REM PT RETURN 9FT ADLT (ELECTROSURGICAL) ×3
ELECTRODE REM PT RTRN 9FT ADLT (ELECTROSURGICAL) ×1 IMPLANT
FACESHIELD WRAPAROUND (MASK) ×3 IMPLANT
FACESHIELD WRAPAROUND OR TEAM (MASK) ×1 IMPLANT
GAUZE XEROFORM 1X8 LF (GAUZE/BANDAGES/DRESSINGS) ×3 IMPLANT
GLOVE BIO SURGEON STRL SZ8 (GLOVE) ×3 IMPLANT
GLOVE BIOGEL PI IND STRL 8 (GLOVE) ×1 IMPLANT
GLOVE BIOGEL PI INDICATOR 8 (GLOVE) ×2
GLOVE ORTHO TXT STRL SZ7.5 (GLOVE) ×3 IMPLANT
GOWN STRL REUS W/ TWL LRG LVL3 (GOWN DISPOSABLE) ×1 IMPLANT
GOWN STRL REUS W/ TWL XL LVL3 (GOWN DISPOSABLE) ×2 IMPLANT
GOWN STRL REUS W/TWL LRG LVL3 (GOWN DISPOSABLE) ×3
GOWN STRL REUS W/TWL XL LVL3 (GOWN DISPOSABLE) ×6
GUIDE PIN 3.2MM (MISCELLANEOUS) ×3
GUIDE PIN ORTH 343X3.2XBRAD (MISCELLANEOUS) IMPLANT
KIT BASIN OR (CUSTOM PROCEDURE TRAY) ×3 IMPLANT
KIT ROOM TURNOVER OR (KITS) ×3 IMPLANT
LINER BOOT UNIVERSAL DISP (MISCELLANEOUS) ×3 IMPLANT
MANIFOLD NEPTUNE II (INSTRUMENTS) ×3 IMPLANT
NAIL RIGHT 10X38MM (Nail) ×3 IMPLANT
NS IRRIG 1000ML POUR BTL (IV SOLUTION) ×3 IMPLANT
PACK GENERAL/GYN (CUSTOM PROCEDURE TRAY) ×3 IMPLANT
PAD ARMBOARD 7.5X6 YLW CONV (MISCELLANEOUS) ×6 IMPLANT
SCREW LAG COMPR KIT 105/100 (Screw) ×2 IMPLANT
STAPLER VISISTAT 35W (STAPLE) ×3 IMPLANT
SUT VIC AB 0 CT1 27 (SUTURE) ×3
SUT VIC AB 0 CT1 27XBRD ANBCTR (SUTURE) ×2 IMPLANT
SUT VIC AB 2-0 CT1 27 (SUTURE) ×3
SUT VIC AB 2-0 CT1 TAPERPNT 27 (SUTURE) ×2 IMPLANT
TOWEL OR 17X24 6PK STRL BLUE (TOWEL DISPOSABLE) ×3 IMPLANT
TOWEL OR 17X26 10 PK STRL BLUE (TOWEL DISPOSABLE) ×3 IMPLANT
WATER STERILE IRR 1000ML POUR (IV SOLUTION) ×3 IMPLANT

## 2015-07-01 NOTE — Anesthesia Preprocedure Evaluation (Signed)
Anesthesia Evaluation  Patient identified by MRN, date of birth, ID band Patient awake    Reviewed: Allergy & Precautions, NPO status , Patient's Chart, lab work & pertinent test results  Airway Mallampati: II  TM Distance: >3 FB Neck ROM: Full    Dental no notable dental hx.    Pulmonary former smoker,    Pulmonary exam normal breath sounds clear to auscultation       Cardiovascular negative cardio ROS Normal cardiovascular exam Rhythm:Regular Rate:Normal     Neuro/Psych Anxiety negative neurological ROS     GI/Hepatic negative GI ROS, Neg liver ROS,   Endo/Other  negative endocrine ROS  Renal/GU negative Renal ROS  negative genitourinary   Musculoskeletal negative musculoskeletal ROS (+)   Abdominal   Peds negative pediatric ROS (+)  Hematology negative hematology ROS (+)   Anesthesia Other Findings   Reproductive/Obstetrics negative OB ROS                             Anesthesia Physical Anesthesia Plan  ASA: II  Anesthesia Plan: General   Post-op Pain Management:    Induction: Intravenous  Airway Management Planned: Oral ETT  Additional Equipment:   Intra-op Plan:   Post-operative Plan: Extubation in OR  Informed Consent: I have reviewed the patients History and Physical, chart, labs and discussed the procedure including the risks, benefits and alternatives for the proposed anesthesia with the patient or authorized representative who has indicated his/her understanding and acceptance.   Dental advisory given  Plan Discussed with: CRNA and Surgeon  Anesthesia Plan Comments:         Anesthesia Quick Evaluation

## 2015-07-01 NOTE — Anesthesia Procedure Notes (Signed)
Procedure Name: Intubation Date/Time: 07/01/2015 7:26 PM Performed by: Manuela Schwartz B Pre-anesthesia Checklist: Patient identified, Emergency Drugs available, Suction available, Patient being monitored and Timeout performed Patient Re-evaluated:Patient Re-evaluated prior to inductionOxygen Delivery Method: Circle system utilized Preoxygenation: Pre-oxygenation with 100% oxygen Intubation Type: IV induction and Rapid sequence Laryngoscope Size: Mac and 3 Grade View: Grade II Tube size: 7.5 mm Number of attempts: 1 Airway Equipment and Method: Stylet Placement Confirmation: ETT inserted through vocal cords under direct vision,  positive ETCO2 and breath sounds checked- equal and bilateral Secured at: 22 cm Tube secured with: Tape Dental Injury: Teeth and Oropharynx as per pre-operative assessment

## 2015-07-01 NOTE — Brief Op Note (Signed)
06/30/2015 - 07/01/2015  8:29 PM  PATIENT:  [name]  82 y.o. male  PRE-OPERATIVE DIAGNOSIS:  Fractured Right Hip  POST-OPERATIVE DIAGNOSIS:  Fractured Right Hip  PROCEDURE:  Procedure(s): INTRAMEDULLARY (IM) NAIL FEMORAL (Right)  SURGEON:  Surgeon(s) and Role:    * Mcarthur Rossetti, MD - Primary  ANESTHESIA:   general  EBL:  Total I/O In: 1000 [I.V.:1000] Out: 50 [Blood:50]  BLOOD ADMINISTERED:none  DRAINS: none   LOCAL MEDICATIONS USED:  NONE  SPECIMEN:  No Specimen  DISPOSITION OF SPECIMEN:  N/A  COUNTS:  YES  TOURNIQUET:  * No tourniquets in log *  DICTATION: .Other Dictation: Dictation Number [id number]  PLAN OF CARE: Admit to inpatient   PATIENT DISPOSITION:  PACU - hemodynamically stable.   Delay start of Pharmacological VTE agent (>24hrs) due to surgical blood loss or risk of bleeding: not applicable

## 2015-07-01 NOTE — Progress Notes (Signed)
Report given Merrilee Seashore, RN

## 2015-07-01 NOTE — Progress Notes (Signed)
Patient ID: [name], male   DOB: 11-03-1926, 83 y.o.   MRN: [id number] Surgery is scheduled for this evening to fix his right hip fracture.  His son is at the bedside and understands this fully.  He can have some ice chips.

## 2015-07-01 NOTE — Transfer of Care (Signed)
Immediate Anesthesia Transfer of Care Note  Patient: [name]  Procedure(s) Performed: Procedure(s): INTRAMEDULLARY (IM) NAIL FEMORAL (Right)  Patient Location: PACU  Anesthesia Type:General  Level of Consciousness: responds to stimulation  Airway & Oxygen Therapy: Patient Spontanous Breathing  Post-op Assessment: Report given to RN and Post -op Vital signs reviewed and stable  Post vital signs: Reviewed and stable  Last Vitals:  Filed Vitals:   07/01/15 0549 07/01/15 1553  BP: 126/74 123/53  Pulse: 94 87  Temp: 36.7 C 36.7 C  Resp: 19 18    Complications: No apparent anesthesia complications

## 2015-07-01 NOTE — Anesthesia Postprocedure Evaluation (Signed)
Anesthesia Post Note  Patient: [name]  Procedure(s) Performed: Procedure(s) (LRB): INTRAMEDULLARY (IM) NAIL FEMORAL (Right)  Patient location during evaluation: PACU Anesthesia Type: General Level of consciousness: awake and alert Pain management: pain level controlled Vital Signs Assessment: post-procedure vital signs reviewed and stable Respiratory status: spontaneous breathing, nonlabored ventilation and respiratory function stable Cardiovascular status: blood pressure returned to baseline and stable Postop Assessment: no signs of nausea or vomiting Anesthetic complications: no    Last Vitals:  Filed Vitals:   07/01/15 2115 07/01/15 2120  BP: 123/78   Pulse: 106 104  Temp:    Resp: 19 16    Last Pain:  Filed Vitals:   07/01/15 2121  PainSc: Asleep                 Rasean Joos,W. EDMOND

## 2015-07-01 NOTE — Progress Notes (Signed)
Received patient from PACU. Patient alert and oriented to self, VS stable with elevated pulse rate, O2Sat at 95% on RA, pain at 3/10, ice pack applied to surgery site, and noted anxious looking for his wife. Son at bedside providing comfort and support. Son oriented to room, bed controls and call light.  Administered PRN medication 0.5 mg Xanax tablet for anxiety.

## 2015-07-01 NOTE — Progress Notes (Signed)
Patient Demographics  [name], is a 82 y.o. male, DOB - [birth year], [id number]  Admit date - 06/30/2015   Admitting Physician Theressa Millard, MD  Outpatient Primary MD for the patient is Glo Herring., MD  LOS - 1   Chief Complaint  Patient presents with  . Hip Injury    Obvious deformity       Admission HPI/Brief narrative: 82 y.o. male with History of Bladder Ca, BPH, GERDpresents to  ED with a fall at bathroom ,has  increased pain in his right hip , found to have a closed Right Femoral Neck Fracture on X-ray . Orthopedics DR Ninfa Linden was consulted plans for surgical repair this evening . Subjective:   Francena Hanly today has, No headache, No chest pain, No abdominal pain - No Nausea,  reports cough at night, denies shortness of breath , right hip pain on movement  Assessment & Plan    Principal Problem:   Closed right hip fracture [facility]) Active Problems:   BPH (benign prostatic hyperplasia)   GERD (gastroesophageal reflux disease)   Fall   right hip fracture  - Orthopedic consult greatly appreciated, continue with nothing by mouth, when necessary pain and nausea medication, plan is for surgical repair this evening, will start on DVT prophylaxis postoperatively . - No further workup indicated prior to surgery - PT consult after surgery  BPH - Continue with Flomax  GERD  - As well patient reports cough, significant at night, this is most likely due to reflux disease, will start on PPI   Code Status: Full  Family Communication: son at bedside  Disposition Plan: pending PT post op.   Procedures  none   Consults   ortho   Medications  Scheduled Meds: . pantoprazole (PROTONIX) IV  40 mg Intravenous Q24H  . tamsulosin  0.4 mg Oral Daily   Continuous Infusions: . sodium chloride 75 mL/hr at 07/01/15 1059   PRN Meds:.acetaminophen **OR** acetaminophen,  ALPRAZolam, alum & mag hydroxide-simeth, HYDROmorphone (DILAUDID) injection, ondansetron **OR** ondansetron (ZOFRAN) IV, oxyCODONE  DVT Prophylaxis  will start on subcutaneous Lovenox postoperatively  Lab Results  Component Value Date   PLT 178 07/01/2015    Antibiotics    Anti-infectives    None          Objective:   Filed Vitals:   06/30/15 2300 06/30/15 2315 06/30/15 2355 07/01/15 0549  BP: 124/74 133/68 139/64 126/74  Pulse: 90 97 92 94  Temp:   98.4 F (36.9 C) 98.1 F (36.7 C)  TempSrc:   Oral Oral  Resp: 35 25 20 19   Height:   5\' 6"  (1.676 m)   Weight:   67.8 kg (149 lb 7.6 oz)   SpO2: 97% 97% 96% 95%    Wt Readings from Last 3 Encounters:  06/30/15 67.8 kg (149 lb 7.6 oz)  07/02/14 66.225 kg (146 lb)  05/16/13 66.225 kg (146 lb)     Intake/Output Summary (Last 24 hours) at 07/01/15 1456 Last data filed at 07/01/15 1200  Gross per 24 hour  Intake    375 ml  Output    375 ml  Net      0 ml     Physical Exam  Awake Alert, Oriented X 3,  No new F.N deficits, Normal affect [place].AT,PERRAL Supple Neck,No JVD, No cervical lymphadenopathy appriciated.  Symmetrical Chest wall movement, Good air movement bilaterally, CTAB RRR,No Gallops,Rubs or new Murmurs, No Parasternal Heave +ve B.Sounds, Abd Soft, No tenderness, No organomegaly appriciated, No rebound - guarding or rigidity. No Cyanosis, Clubbing or edema, No new Rash or bruise    Data Review   Micro Results Recent Results (from the past 240 hour(s))  Surgical pcr screen     Status: None   Collection Time: 07/01/15  4:41 AM  Result Value Ref Range Status   MRSA, PCR NEGATIVE NEGATIVE Final   Staphylococcus aureus NEGATIVE NEGATIVE Final    Comment:        The Xpert SA Assay (FDA approved for NASAL specimens in patients over 40 years of age), is one component of a comprehensive surveillance program.  Test performance has been validated by [facility] for patients greater than or equal to 16  year old. It is not intended to diagnose infection nor to guide or monitor treatment.     Radiology Reports Dg Hip Unilat With Pelvis 2-3 Views Right  06/30/2015  CLINICAL DATA:  Fall while walking with a walker, now with right hip pain. EXAM: DG HIP (WITH OR WITHOUT PELVIS) 2-3V RIGHT COMPARISON:  None. FINDINGS: Displaced angulated fracture of the right femoral neck with mild proximal shortening of the femoral shaft. Femoral head remains located. No additional fracture of the bony pelvis. Vascular calcifications are seen. Surgical clips project over the left inguinal region. IMPRESSION: Displaced angulated right femoral neck fracture. Electronically Signed   By: Jeb Levering M.D.   On: 06/30/2015 20:02   Dg Femur, Min 2 Views Right  06/30/2015  CLINICAL DATA:  Right leg pain after fall while walking with a walker. EXAM: RIGHT FEMUR 2 VIEWS COMPARISON:  None. FINDINGS: Angulated femoral neck fracture with proximal migration of the femoral shaft. Distal femur is intact. No additional acute fracture. Knee alignment is grossly maintained. IMPRESSION: Femoral neck fracture.  Distal femur is intact. Electronically Signed   By: Jeb Levering M.D.   On: 06/30/2015 20:06     CBC  Recent Labs Lab 06/30/15 1910 07/01/15 0425  WBC 9.7 9.9  HGB 14.1 12.4*  HCT 41.5 37.6*  PLT 202 178  MCV 95.8 96.9  MCH 32.6 32.0  MCHC 34.0 33.0  RDW 12.6 12.7  LYMPHSABS 1.3  --   MONOABS 0.8  --   EOSABS 0.1  --   BASOSABS 0.0  --     Chemistries   Recent Labs Lab 06/30/15 1910 07/01/15 0425  NA 137 137  K 4.0 4.2  CL 105 105  CO2 21* 23  GLUCOSE 127* 131*  BUN 10 10  CREATININE 0.80 0.75  CALCIUM 9.3 8.5*  AST 24  --   ALT 7*  --   ALKPHOS 80  --   BILITOT 1.0  --    ------------------------------------------------------------------------------------------------------------------ estimated creatinine clearance is 59.8 mL/min (by C-G formula based on Cr of  0.75). ------------------------------------------------------------------------------------------------------------------ No results for input(s): HGBA1C in the last 72 hours. ------------------------------------------------------------------------------------------------------------------ No results for input(s): CHOL, HDL, LDLCALC, TRIG, CHOLHDL, LDLDIRECT in the last 72 hours. ------------------------------------------------------------------------------------------------------------------ No results for input(s): TSH, T4TOTAL, T3FREE, THYROIDAB in the last 72 hours.  Invalid input(s): FREET3 ------------------------------------------------------------------------------------------------------------------ No results for input(s): VITAMINB12, FOLATE, FERRITIN, TIBC, IRON, RETICCTPCT in the last 72 hours.  Coagulation profile No results for input(s): INR, PROTIME in the last 168 hours.  No results for input(s): DDIMER  in the last 72 hours.  Cardiac Enzymes No results for input(s): CKMB, TROPONINI, MYOGLOBIN in the last 168 hours.  Invalid input(s): CK ------------------------------------------------------------------------------------------------------------------ Invalid input(s): POCBNP     Time Spent in minutes   25 minutes   Vivion Romano M.D on 07/01/2015 at 2:56 PM  Between 7am to 7pm - Pager - [phone]  After 7pm go to [email or web address] - password [facility]  [facility]   Office  [phone]

## 2015-07-02 ENCOUNTER — Encounter (HOSPITAL_COMMUNITY): Payer: Self-pay | Admitting: Orthopaedic Surgery

## 2015-07-02 DIAGNOSIS — S72001S Fracture of unspecified part of neck of right femur, sequela: Secondary | ICD-10-CM

## 2015-07-02 LAB — BASIC METABOLIC PANEL
Anion gap: 10 (ref 5–15)
BUN: 9 mg/dL (ref 6–20)
CALCIUM: 8.4 mg/dL — AB (ref 8.9–10.3)
CO2: 23 mmol/L (ref 22–32)
CREATININE: 0.76 mg/dL (ref 0.61–1.24)
Chloride: 103 mmol/L (ref 101–111)
GFR calc Af Amer: >60 mL/min (ref >60)
GLUCOSE: 125 mg/dL — AB (ref 65–99)
Potassium: 4.1 mmol/L (ref 3.5–5.1)
SODIUM: 136 mmol/L (ref 135–145)

## 2015-07-02 LAB — CBC
HCT: 35.6 % — ABNORMAL LOW (ref 39.0–52.0)
Hemoglobin: 11.6 g/dL — ABNORMAL LOW (ref 13.0–17.0)
MCH: 31.8 pg (ref 26.0–34.0)
MCHC: 32.6 g/dL (ref 30.0–36.0)
MCV: 97.5 fL (ref 78.0–100.0)
PLATELETS: 158 10*3/uL (ref 150–400)
RBC: 3.65 MIL/uL — AB (ref 4.22–5.81)
RDW: 13 % (ref 11.5–15.5)
WBC: 7.6 10*3/uL (ref 4.0–10.5)

## 2015-07-02 MED ORDER — HYDROCODONE-ACETAMINOPHEN 5-325 MG PO TABS: 1.0000 | ORAL_TABLET | ORAL | Status: DC | PRN

## 2015-07-02 MED ORDER — ASPIRIN 325 MG PO TBEC: 325.0000 mg | DELAYED_RELEASE_TABLET | Freq: Every day | ORAL | Status: DC

## 2015-07-02 MED ORDER — PANTOPRAZOLE SODIUM 40 MG PO TBEC
40.0000 mg | DELAYED_RELEASE_TABLET | Freq: Every day | ORAL | Status: DC
  Administered 2015-07-02 – 2015-07-03 (×2): 40 mg via ORAL
  Filled 2015-07-02 (×2): qty 1

## 2015-07-02 NOTE — Discharge Instructions (Signed)
Full weight-bearing as tolerated right hip. Can get incisions wet daily in the shower, then new dry dressing daily.

## 2015-07-02 NOTE — Progress Notes (Signed)
Subjective: 1 Day Post-Op Procedure(s) (LRB): INTRAMEDULLARY (IM) NAIL FEMORAL (Right) Patient reports pain as moderate.  Awake and alert with son at the bedside.  Sitting up and eating.  Objective: Vital signs in last 24 hours: Temp:  [97.1 F (36.2 C)-98.4 F (36.9 C)] 97.5 F (36.4 C) (01/10 0515) Pulse Rate:  [85-110] 110 (01/10 0515) Resp:  [13-19] 18 (01/10 0515) BP: (103-142)/(53-78) 142/64 mmHg (01/10 0515) SpO2:  [94 %-100 %] 97 % (01/10 0515)  Intake/Output from previous day: 01/09 0701 - 01/10 0700 In: 2388.8 [P.O.:290; I.V.:1998.8; IV Piggyback:100] Out: 825 [Urine:775; Blood:50] Intake/Output this shift:     Recent Labs  06/30/15 1910 07/01/15 0425 07/02/15 0354  HGB 14.1 12.4* 11.6*    Recent Labs  07/01/15 0425 07/02/15 0354  WBC 9.9 7.6  RBC 3.88* 3.65*  HCT 37.6* 35.6*  PLT 178 158    Recent Labs  07/01/15 0425 07/02/15 0354  NA 137 136  K 4.2 4.1  CL 105 103  CO2 23 23  BUN 10 9  CREATININE 0.75 0.76  GLUCOSE 131* 125*  CALCIUM 8.5* 8.4*   No results for input(s): LABPT, INR in the last 72 hours.  Intact pulses distally Dorsiflexion/Plantar flexion intact Incision: scant drainage  Assessment/Plan: 1 Day Post-Op Procedure(s) (LRB): INTRAMEDULLARY (IM) NAIL FEMORAL (Right) Up with therapy - WBAT right hip Will likely need short-term SNF placement  [name] Y 07/02/2015, 9:03 AM

## 2015-07-02 NOTE — Care Management Note (Signed)
Case Management Note  Patient Details  Name: [name] MRN: [id number] Date of Birth: [birth year]  Subjective/Objective:                    Action/Plan:  Initial UR completed . Will await PT/OT evals Expected Discharge Date:                  Expected Discharge Plan:     In-House Referral:  Clinical Social Work  Discharge planning Services  CM Consult  Post Acute Care Choice:  Home Health Choice offered to:     DME Arranged:    DME Agency:     HH Arranged:    HH Agency:     Status of Service:  In process, will continue to follow  Medicare Important Message Given:    Date Medicare IM Given:    Medicare IM give by:    Date Additional Medicare IM Given:    Additional Medicare Important Message give by:     If discussed at [place] of Stay Meetings, dates discussed:    Additional Comments:  Marilu Favre, RN 07/02/2015, 8:21 AM

## 2015-07-02 NOTE — Op Note (Signed)
NAME:  [name], [name] NO.:  [id number]  MEDICAL RECORD NO.:  [id number]  LOCATION:  [id number]                        FACILITY:  [facility]  PHYSICIAN:  Lind Guest. Ninfa Linden, M.D.DATE OF BIRTH:  1926/12/28  DATE OF PROCEDURE:  07/01/2015 DATE OF DISCHARGE:                              OPERATIVE REPORT   PREOPERATIVE DIAGNOSIS:  Right hip high intertrochanteric versus low basicervical femoral neck fracture.  POSTOPERATIVE DIAGNOSIS:  Right hip high intertrochanteric versus low basicervical femoral neck fracture.  PROCEDURE:  Intramedullary nail with hip screw placement, right hip.  IMPLANTS:  Tam[employer] INTERTAN femoral nail with a 10-mm x 38-cm femoral nail and 100-mm compression screw and 105-mm lag screw.  SURGEON:  Lind Guest. Ninfa Linden, M.D.  ANESTHESIA:  General.  ANTIBIOTICS:  900 mg of IV clindamycin.  BLOOD LOSS:  Less than 100 mL.  COMPLICATIONS:  None.  INDICATIONS:  Mr. Terrasi is a very pleasant 82 year old, who yesterday sustained a mechanical fall when he was at home.  He was seen at the hospital emergency room after the inability to ambulate and found to have a low basicervical versus high intertrochanteric hip fracture.  We have recommended open reduction and internal fixation given the nature of this fracture and the potential for healing without needing a partial or total hip replacement.  The risks and benefits of the surgery were explained in detail and he and his family did wish to proceed.  PROCEDURE DESCRIPTION:  After informed consent was obtained, appropriate right hip was marked.  He was brought to the operating room where general anesthesia was obtained while he was on the stretcher.  He was next placed supine on the fracture table with his right leg in in-line skeletal traction.  The perineal post in place and the left hip flexed and abducted out of the field and appropriate padding and a stirrup.  We then assessed the  right hip under direct fluoroscopy and with traction and internal rotation, we were able to reduce the fracture adequately. We then chose our femoral nail length based on holding a sterile nail in the box on top of the leg.  We decided to go with a 10-mm x 38-mm femoral nail.  We then prepped the right hip with DuraPrep and sterile drapes.  Time-out was called and he identified as correct patient and correct right hip.  I then made an incision proximal to the greater trochanter and carried this down to the tip of the greater trochanter under direct fluoroscopy.  I placed the temporary guidepin from the tip of the greater trochanter down antegrade down the femur.  We then used an initiating reamer to open up the femoral canal and then we were able to pass the femoral rod without needing to ream down the femoral canal. Once we were pleased with position in the femur, we used the outrigger guide to make a separate lateral thigh incision.  We then placed a temporary guidepins to traverse the fracture and into the center position on the femoral head.  We then took a measurement off this and chose an integrated interlocking lag screw system with 105-mm lag screw and 100-mm compression screw.  We drilled to  these depth and then placed these screws without difficulty.  Lead some traction off the bed and then placed in the compression component.  We were pleased with the reduction.  We removed all instrumentation and irrigated the two small wounds with normal saline solution using bulb syringe.  We closed the deep tissue with 2-0 Vicryl followed by 2-0 Vicryl in the subcutaneous tissue and interrupted staples on the skin.  Xeroform and well-padded sterile dressing were applied.  He was taken off the fracture table, awakened, extubated and taken to the recovery room in stable condition. All final counts were correct.  There were no complications noted.     Lind Guest. Ninfa Linden,  M.D.     CYB/MEDQ  D:  07/01/2015  T:  07/02/2015  Job:  [id number]

## 2015-07-02 NOTE — Evaluation (Signed)
Physical Therapy Evaluation Patient Details Name: [name] MRN: [id number] DOB: 1926-12-15 Today's Date: 07/02/2015   History of Present Illness  Pt is a 82 y.o. male admitted on 06/30/15 for R femoral neck fx. Pt had IM nail surgery on 07/01/15. Pt has other significant PMH of bladder cancer, BPH, GERD, DVT, and anxiety.  Clinical Impression  Pt was agreeable and willing to work during his session. Pt successfully completed his hip exercises without an increase in pain. With assistance, Pt transferred to EOB. With 2+ assist, Pt was able to transfer to chair next to bed by taking small shuffling steps. Pt would like to d/c home with wife, but seems to understand that may be challenging.  PT to follow acutely for deficits listed below.       Follow Up Recommendations SNF;Supervision/Assistance - 24 hour    Equipment Recommendations  None recommended by PT    Recommendations for Other Services Rehab consult     Precautions / Restrictions Precautions Precautions: Fall Precaution Comments: Pt has a history of falls.  Restrictions Weight Bearing Restrictions: Yes RLE Weight Bearing: Weight bearing as tolerated      Mobility  Bed Mobility Overal bed mobility: Needs Assistance Bed Mobility: Supine to Sit     Supine to sit: Mod assist;HOB elevated     General bed mobility comments: Pt was able to use rails to pull up to full sitting and to help rotate to side. Pt required assistance in moving RLE to EOB. Once in seated position, lowering HOB helped Pt get to EOB.  Transfers Overall transfer level: Needs assistance Equipment used: Rolling walker (2 wheeled) Transfers: Sit to/from Stand Sit to Stand: +2 physical assistance;Mod assist         General transfer comment: Pt required +2 in order to transfer from EOB to chair. Pt does transfer weight onto RLE, but when he shifts weight his RLE buckles. When standing, Pt leans to right side and maintains a flexed trunk unless  verbally cued to stand straight.  Ambulation/Gait Ambulation/Gait assistance: Mod assist;+2 physical assistance Ambulation Distance (Feet): 3 Feet Assistive device: Rolling walker (2 wheeled) Gait Pattern/deviations: Shuffle;Antalgic     General Gait Details: Pt did not take full steps in pivoting to turn to sit in chair.          Balance Overall balance assessment: Needs assistance Sitting-balance support: Bilateral upper extremity supported;Feet supported Sitting balance-Leahy Scale: Poor Sitting balance - Comments: Pt could sit EOB with both hands on bed for support and feet on the floor. Pt also required support on his back to prevent him from leaning or falling backwards. Postural control: Right lateral lean Standing balance support: Bilateral upper extremity supported Standing balance-Leahy Scale: Poor Standing balance comment: Pt was able to stand with both arms supported on RW and +2 to support under both arms. Pt requires extra help on right side as he shifts his weight to the right due to buckling. When cued to stand straight, Pt able to maintain standing balance and straighten.                              Pertinent Vitals/Pain Pain Assessment: Faces Faces Pain Scale: Hurts little more Pain Location: right hip  Pain Descriptors / Indicators: Grimacing Pain Intervention(s): Limited activity within patient's tolerance;Monitored during session;Repositioned    Home Living Family/patient expects to be discharged to:: Private residence Living Arrangements: Spouse/significant other (Wife had heart attack about  3 months ago. Stress is problem) Available Help at Discharge: Family (Two sons live nearby. Wife available full time. ) Type of Home: House Home Access: Stairs to enter Entrance Stairs-Rails: None Entrance Stairs-Number of Steps: 1 (One small step up from garage into house. ) Home Layout: One level [facility] bed;Walker - 2 wheels;Grab  bars - tub/shower;Shower seat - built in;Shower seat;Bedside commode (Pt does not get into shower, wife gives sponge bath.) Additional Comments: Pt uses urinal at bedside if he wakes in the night while leaning against the bed for support. Pt reports being unsteady.    Prior Function Level of Independence: Needs assistance   Gait / Transfers Assistance Needed: Pt always uses a RW, and uses a wheelchair whenever he went out. At the end of the day, family reports his legs will buckle, and he required someone to physically hold on to him.  ADL's / Homemaking Assistance Needed: Pt reports his wife helps him with sponge baths.   Comments: Pt used wheelchair if needed while out, but always used a RW.         Extremity/Trunk Assessment               Lower Extremity Assessment: Generalized weakness         Communication   Communication: No difficulties  Cognition Arousal/Alertness: Awake/alert Behavior During Therapy: WFL for tasks assessed/performed;Anxious Overall Cognitive Status: Within Functional Limits for tasks assessed       Memory: Decreased short-term memory (per Pt report )                 Exercises Total Joint Exercises Ankle Circles/Pumps: AROM;Both;20 reps Quad Sets: AROM;Right;10 reps Heel Slides: AROM;10 reps;Right Hip ABduction/ADduction: AROM;Right;10 reps      Assessment/Plan    PT Assessment Patient needs continued PT services  PT Diagnosis Difficulty walking;Generalized weakness;Acute pain   PT Problem List Decreased strength;Decreased range of motion;Decreased activity tolerance;Decreased balance;Decreased mobility;Decreased knowledge of use of DME;Pain  PT Treatment Interventions DME instruction;Functional mobility training;Therapeutic exercise;Therapeutic activities   PT Goals (Current goals can be found in the Care Plan section) Acute Rehab PT Goals Patient Stated Goal: Pt wants to d/c to home with his supportive wife, but does not want to  place too much stress on her especially given her health. PT Goal Formulation: With patient Time For Goal Achievement: 07/16/15 Potential to Achieve Goals: Fair    Frequency 5X/week   Barriers to discharge Decreased caregiver support (Wife is in home, but will be unable to transfer him ) Pt has an accessible home and access to needed equipment. Pt lives full time with wife in home, who had a heart attack 3 months ago, and she will be unable to transfer him in current state. Stress is going to be a challenge.       End of Session Equipment Utilized During Treatment: Gait belt Activity Tolerance: Patient limited by fatigue;Patient limited by pain Patient left: in chair;with call bell/phone within reach;with family/visitor present Nurse Communication: Mobility status (communicated need for +2 transfer, RW, and R side buckling)         Time: [id number] PT Time Calculation (min) (ACUTE ONLY): 69 min   Charges:   PT Evaluation $PT Eval Moderate Complexity: 1 Procedure PT Treatments $Therapeutic Exercise: 8-22 mins $Therapeutic Activity: 8-22 mins $Self Care/Home Management: 8-22   PT G CodesArelia Sneddon, [place] office Arelia Sneddon 07/02/2015, 4:38 PM

## 2015-07-02 NOTE — Progress Notes (Signed)
Patient Demographics  [name], is a 82 y.o. male, DOB - 01-07-1927, [id number]  Admit date - 06/30/2015   Admitting Physician Theressa Millard, MD  Outpatient Primary MD for the patient is Glo Herring., MD  LOS - 2   Chief Complaint  Patient presents with  . Hip Injury    Obvious deformity       Admission HPI/Brief narrative: 82 y.o. male with History of Bladder Ca, BPH, GERDpresents to  ED with a fall at bathroom ,has  increased pain in his right hip , found to have a closed Right Femoral Neck Fracture on X-ray . Orthopedics DR Ninfa Linden was consulted ,S/P  surgical repair 07/01/2015. Subjective:   [name] today has, No headache, No chest pain, No abdominal pain - No Nausea,  reports cough at night, denies shortness of breath , right hip pain on movement  Assessment & Plan    Principal Problem:   Closed right hip fracture (HCC) Active Problems:   BPH (benign prostatic hyperplasia)   GERD (gastroesophageal reflux disease)   Fall   right hip fracture  - Orthopedic consult greatly appreciated,  - status postINTRAMEDULLARY (IM) NAIL FEMORAL (Right) - Continue with when necessary pain medication - PT recommending SNF placement - DVT prophylaxis per orthopedic team, on full dose aspirin  BPH - Continue with Flomax  GERD  - As well patient reports cough, significant at night, this is most likely due to reflux disease, will started  on PPI   Code Status: Full  Family Communication: son at bedside  Disposition Plan: will need SNF placment   Procedures  INTRAMEDULLARY (IM) NAIL FEMORAL (Right) 07/01/2015 by Dr. Ninfa Linden   Consults   ortho   Medications  Scheduled Meds: . aspirin EC  325 mg Oral Q breakfast  . pantoprazole  40 mg Oral Daily  . tamsulosin  0.4 mg Oral Daily   Continuous Infusions: . sodium chloride 75 mL/hr at 07/01/15 1059  . sodium  chloride 75 mL/hr at 07/01/15 2145  . lactated ringers Stopped (07/01/15 2145)   PRN Meds:.acetaminophen **OR** acetaminophen, ALPRAZolam, alum & mag hydroxide-simeth, HYDROcodone-acetaminophen, HYDROmorphone (DILAUDID) injection, menthol-cetylpyridinium **OR** phenol, methocarbamol **OR** methocarbamol (ROBAXIN)  IV, metoCLOPramide **OR** metoCLOPramide (REGLAN) injection, morphine injection, ondansetron **OR** ondansetron (ZOFRAN) IV, oxyCODONE  DVT Prophylaxis  On full dose aspirin  Lab Results  Component Value Date   PLT 158 07/02/2015    Antibiotics    Anti-infectives    Start     Dose/Rate Route Frequency Ordered Stop   07/02/15 0000  clindamycin (CLEOCIN) IVPB 600 mg     600 mg 100 mL/hr over 30 Minutes Intravenous Every 6 hours 07/01/15 2138 07/02/15 0634          Objective:   Filed Vitals:   07/02/15 0325 07/02/15 0515 07/02/15 1045 07/02/15 1400  BP:  142/64 107/53 130/63  Pulse:  110 66 96  Temp:  97.5 F (36.4 C) 98.3 F (36.8 C) 98.3 F (36.8 C)  TempSrc:  Oral Oral Oral  Resp:  18 20 20   Height:      Weight:      SpO2: 95% 97% 100% 99%    Wt Readings from Last 3 Encounters:  06/30/15 67.8 kg (149 lb 7.6  oz)  07/02/14 66.225 kg (146 lb)  05/16/13 66.225 kg (146 lb)     Intake/Output Summary (Last 24 hours) at 07/02/15 1731 Last data filed at 07/02/15 0629  Gross per 24 hour  Intake 1963.75 ml  Output    400 ml  Net 1563.75 ml     Physical Exam  Awake Alert, Oriented X 3, No new F.N deficits, Normal affect [place].AT,PERRAL Supple Neck,No JVD, No cervical lymphadenopathy appriciated.  Symmetrical Chest wall movement, Good air movement bilaterally, CTAB RRR,No Gallops,Rubs or new Murmurs, No Parasternal Heave +ve B.Sounds, Abd Soft, No tenderness, No organomegaly appriciated, No rebound - guarding or rigidity. No Cyanosis, Clubbing or edema, No new Rash or bruise    Data Review   Micro Results Recent Results (from the past 240 hour(s))   Surgical pcr screen     Status: None   Collection Time: 07/01/15  4:41 AM  Result Value Ref Range Status   MRSA, PCR NEGATIVE NEGATIVE Final   Staphylococcus aureus NEGATIVE NEGATIVE Final    Comment:        The Xpert SA Assay (FDA approved for NASAL specimens in patients over 21 years of age), is one component of a comprehensive surveillance program.  Test performance has been validated by [facility] for patients greater than or equal to 48 year old. It is not intended to diagnose infection nor to guide or monitor treatment.     Radiology Reports Dg C-arm 1-60 Min  07/01/2015  CLINICAL DATA:  82 year old male status post right femoral nail ORIF. EXAM: RIGHT FEMUR 2 VIEWS; DG C-ARM 61-120 MIN COMPARISON:  Right femur radiograph 06/30/2015. FINDINGS: Six intraoperative radiographs document interval placement of a long stem gamma nail fixation device traversing the previously noted femoral neck fracture, with restoration of anatomic alignment. Femoral head is located in the acetabulum. No acute complicating features. IMPRESSION: 1. Intraoperative documentation of gamma nail fixation for right femoral neck fracture, as above. Electronically Signed   By: Vinnie Langton M.D.   On: 07/01/2015 20:35   Dg Hip Unilat With Pelvis 2-3 Views Right  06/30/2015  CLINICAL DATA:  Fall while walking with a walker, now with right hip pain. EXAM: DG HIP (WITH OR WITHOUT PELVIS) 2-3V RIGHT COMPARISON:  None. FINDINGS: Displaced angulated fracture of the right femoral neck with mild proximal shortening of the femoral shaft. Femoral head remains located. No additional fracture of the bony pelvis. Vascular calcifications are seen. Surgical clips project over the left inguinal region. IMPRESSION: Displaced angulated right femoral neck fracture. Electronically Signed   By: Jeb Levering M.D.   On: 06/30/2015 20:02   Dg Femur, Min 2 Views Right  07/01/2015  CLINICAL DATA:  82 year old male status post right  femoral nail ORIF. EXAM: RIGHT FEMUR 2 VIEWS; DG C-ARM 61-120 MIN COMPARISON:  Right femur radiograph 06/30/2015. FINDINGS: Six intraoperative radiographs document interval placement of a long stem gamma nail fixation device traversing the previously noted femoral neck fracture, with restoration of anatomic alignment. Femoral head is located in the acetabulum. No acute complicating features. IMPRESSION: 1. Intraoperative documentation of gamma nail fixation for right femoral neck fracture, as above. Electronically Signed   By: Vinnie Langton M.D.   On: 07/01/2015 20:35   Dg Femur, Min 2 Views Right  06/30/2015  CLINICAL DATA:  Right leg pain after fall while walking with a walker. EXAM: RIGHT FEMUR 2 VIEWS COMPARISON:  None. FINDINGS: Angulated femoral neck fracture with proximal migration of the femoral shaft. Distal femur is  intact. No additional acute fracture. Knee alignment is grossly maintained. IMPRESSION: Femoral neck fracture.  Distal femur is intact. Electronically Signed   By: Jeb Levering M.D.   On: 06/30/2015 20:06     CBC  Recent Labs Lab 06/30/15 1910 07/01/15 0425 07/02/15 0354  WBC 9.7 9.9 7.6  HGB 14.1 12.4* 11.6*  HCT 41.5 37.6* 35.6*  PLT 202 178 158  MCV 95.8 96.9 97.5  MCH 32.6 32.0 31.8  MCHC 34.0 33.0 32.6  RDW 12.6 12.7 13.0  LYMPHSABS 1.3  --   --   MONOABS 0.8  --   --   EOSABS 0.1  --   --   BASOSABS 0.0  --   --     Chemistries   Recent Labs Lab 06/30/15 1910 07/01/15 0425 07/02/15 0354  NA 137 137 136  K 4.0 4.2 4.1  CL 105 105 103  CO2 21* 23 23  GLUCOSE 127* 131* 125*  BUN 10 10 9   CREATININE 0.80 0.75 0.76  CALCIUM 9.3 8.5* 8.4*  AST 24  --   --   ALT 7*  --   --   ALKPHOS 80  --   --   BILITOT 1.0  --   --    ------------------------------------------------------------------------------------------------------------------ estimated creatinine clearance is 59.8 mL/min (by C-G formula based on Cr of  0.76). ------------------------------------------------------------------------------------------------------------------ No results for input(s): HGBA1C in the last 72 hours. ------------------------------------------------------------------------------------------------------------------ No results for input(s): CHOL, HDL, LDLCALC, TRIG, CHOLHDL, LDLDIRECT in the last 72 hours. ------------------------------------------------------------------------------------------------------------------ No results for input(s): TSH, T4TOTAL, T3FREE, THYROIDAB in the last 72 hours.  Invalid input(s): FREET3 ------------------------------------------------------------------------------------------------------------------ No results for input(s): VITAMINB12, FOLATE, FERRITIN, TIBC, IRON, RETICCTPCT in the last 72 hours.  Coagulation profile No results for input(s): INR, PROTIME in the last 168 hours.  No results for input(s): DDIMER in the last 72 hours.  Cardiac Enzymes No results for input(s): CKMB, TROPONINI, MYOGLOBIN in the last 168 hours.  Invalid input(s): CK ------------------------------------------------------------------------------------------------------------------ Invalid input(s): POCBNP     Time Spent in minutes   20 minutes   Diangelo Radel M.D on 07/02/2015 at 5:31 PM  Between 7am to 7pm - Pager - [phone]  After 7pm go to [email or web address] - password [facility]  [facility]   Office  [phone]

## 2015-07-02 NOTE — Progress Notes (Signed)
RT instructed patient on use of flutter valve. Pt was able to demonstrate back good technique. Family present.

## 2015-07-03 ENCOUNTER — Inpatient Hospital Stay
Admission: RE | Admit: 2015-07-03 | Discharge: 2015-08-07 | Disposition: A | Discharge status: Nursing Home (Includes ICF) | Payer: BLUE CROSS/BLUE SHIELD | Source: Ambulatory Visit | Attending: Internal Medicine | Admitting: Internal Medicine

## 2015-07-03 DIAGNOSIS — K5900 Constipation, unspecified: Secondary | ICD-10-CM

## 2015-07-03 DIAGNOSIS — N400 Enlarged prostate without lower urinary tract symptoms: Secondary | ICD-10-CM

## 2015-07-03 DIAGNOSIS — S72001D Fracture of unspecified part of neck of right femur, subsequent encounter for closed fracture with routine healing: Secondary | ICD-10-CM

## 2015-07-03 DIAGNOSIS — K219 Gastro-esophageal reflux disease without esophagitis: Secondary | ICD-10-CM

## 2015-07-03 LAB — CBC
HCT: 33 % — ABNORMAL LOW (ref 39.0–52.0)
HEMOGLOBIN: 11.1 g/dL — AB (ref 13.0–17.0)
MCH: 32.1 pg (ref 26.0–34.0)
MCHC: 33.6 g/dL (ref 30.0–36.0)
MCV: 95.4 fL (ref 78.0–100.0)
PLATELETS: 150 10*3/uL (ref 150–400)
RBC: 3.46 MIL/uL — AB (ref 4.22–5.81)
RDW: 12.7 % (ref 11.5–15.5)
WBC: 6.1 10*3/uL (ref 4.0–10.5)

## 2015-07-03 LAB — BASIC METABOLIC PANEL
ANION GAP: 7 (ref 5–15)
BUN: 6 mg/dL (ref 6–20)
CO2: 24 mmol/L (ref 22–32)
Calcium: 8.1 mg/dL — ABNORMAL LOW (ref 8.9–10.3)
Chloride: 104 mmol/L (ref 101–111)
Creatinine, Ser: 0.59 mg/dL — ABNORMAL LOW (ref 0.61–1.24)
GFR calc Af Amer: >60 mL/min (ref >60)
GLUCOSE: 91 mg/dL (ref 65–99)
POTASSIUM: 3.5 mmol/L (ref 3.5–5.1)
Sodium: 135 mmol/L (ref 135–145)

## 2015-07-03 MED ORDER — HYDROCODONE-ACETAMINOPHEN 5-325 MG PO TABS: 1.0000 | ORAL_TABLET | ORAL | Status: DC | PRN

## 2015-07-03 MED ORDER — FLEET ENEMA 7-19 GM/118ML RE ENEM
1.0000 | ENEMA | Freq: Once | RECTAL | Status: AC
  Administered 2015-07-03: 1 via RECTAL
  Filled 2015-07-03: qty 1

## 2015-07-03 MED ORDER — ALPRAZOLAM 0.5 MG PO TABS: 0.5000 mg | ORAL_TABLET | Freq: Three times a day (TID) | ORAL | Status: DC | PRN

## 2015-07-03 MED ORDER — DOCUSATE SODIUM 100 MG PO CAPS
100.0000 mg | ORAL_CAPSULE | Freq: Two times a day (BID) | ORAL | Status: DC
  Administered 2015-07-03: 100 mg via ORAL
  Filled 2015-07-03: qty 1

## 2015-07-03 MED ORDER — BISACODYL 10 MG RE SUPP
10.0000 mg | Freq: Once | RECTAL | Status: AC
  Administered 2015-07-03: 10 mg via RECTAL
  Filled 2015-07-03: qty 1

## 2015-07-03 NOTE — Progress Notes (Signed)
Physical Therapy Treatment Patient Details Name: [name] MRN: [id number] DOB: 03-Dec-1926 Today's Date: 07/03/2015    History of Present Illness Pt is a 82 y.o. male admitted on 06/30/15 for R femoral neck fx. Pt had IM nail surgery on 07/01/15. Pt has other significant PMH of bladder cancer, BPH, GERD, DVT, and anxiety.    PT Comments    Pt seemed more disoriented today, but he was still willing to work and do his R hip exercises. Pt was in chair at beginning of session. Pt was transferred to the bed with a RW and +2 mod assist due to buckling of knees especially on right side with weight shift. Pt's family reports d/c location will be to a SNF, but Pt has not been made aware of this plan, yet, as he will likely be upset.   Follow Up Recommendations  SNF;Supervision/Assistance - 24 hour     Equipment Recommendations  None recommended by PT    Recommendations for Other Services Rehab consult     Precautions / Restrictions Precautions Precautions: Fall Precaution Comments: Pt has a history of falls.  Restrictions Weight Bearing Restrictions: Yes RLE Weight Bearing: Weight bearing as tolerated    Mobility  Bed Mobility Overal bed mobility: Needs Assistance Bed Mobility: Sit to Supine       Sit to supine: Mod assist;+2 for physical assistance;HOB elevated   General bed mobility comments: Pt was transferred to the EOB up towards the [facility] to minimize scooting and motion once fully supine. Once supine, the foot of the bed was raised and with max assist Pt was scooted up in bed.  Transfers Overall transfer level: Needs assistance Equipment used: Rolling walker (2 wheeled) Transfers: Sit to/from Stand Sit to Stand: +2 physical assistance;Mod assist         General transfer comment: Pt R LE buckles when weight is shifted to R side. Pt needed verbal cueing and reminders to keep hands on walker during transfer. Pt is able to stand upright, but when transferring tends to  maintain a flexed trunk posture. Today Pt was able to stand up from chair with more ease than yesterday, but the pivoting and transfer to EOB was similar to yesterday.   Ambulation/Gait Ambulation/Gait assistance: Mod assist;+2 physical assistance (took few shuffling steps to reposition in transfer to bed) Ambulation Distance (Feet):  (Did not ambulate a distance. Shuffled to reposition to bed) Assistive device: Rolling walker (2 wheeled) Gait Pattern/deviations: Antalgic;Shuffle     General Gait Details: Pt did not take full steps in pivoting to turn to sit in chair.           Balance Overall balance assessment: Needs assistance Sitting-balance support: Bilateral upper extremity supported;Feet supported   Sitting balance - Comments: Pt was able to sit EOB at end of transfer with both hands on the bed and feet supported on floor. Pt has a posterior lean due to his kyphotic posture, and requries posterior support to maintain upright EOB sitting. Once seated with supported back, Pt demonstrated a right lateral lean, and requires pillows propped under right arm to straighten. Postural control: Right lateral lean;Posterior lean Standing balance support: Bilateral upper extremity supported Standing balance-Leahy Scale: Zero Standing balance comment: Pt is able to stand with both arms supported on RW and +2 mod assist. Pt requies additional support on the right side as he buckles on that side. Pt is able to maintain static standing balance fairly well with +2 mod assist and RW, but once weight  is shifted balance is compromised.                    Cognition Arousal/Alertness: Awake/alert Behavior During Therapy: WFL for tasks assessed/performed;Anxious Overall Cognitive Status: Impaired/Different from baseline Area of Impairment: Awareness     Memory: Decreased short-term memory (per patient report )         General Comments: Pt is more foggy today than he was yesterday. Needed  to be orientated multiple times to what we were going to be doing with him, which could be due to pain medication. Pt is anxious, which likely compounds his situation.    Exercises Total Joint Exercises Ankle Circles/Pumps: AROM;Both;20 reps Quad Sets: AROM;Right;10 reps Heel Slides: AROM;10 reps;Right Hip ABduction/ADduction: AROM;Right;10 reps            Pertinent Vitals/Pain Pain Assessment: Faces Faces Pain Scale: Hurts little more Pain Location: right hip Pain Descriptors / Indicators: Grimacing Pain Intervention(s): Limited activity within patient's tolerance;Monitored during session;Repositioned           PT Goals (current goals can now be found in the care plan section) Acute Rehab PT Goals Patient Stated Goal: none stated  PT Goal Formulation: With patient Time For Goal Achievement: 07/16/15 Potential to Achieve Goals: Fair Progress towards PT goals: Progressing toward goals    Frequency  Min 3X/week    PT Plan Frequency needs to be updated       End of Session Equipment Utilized During Treatment: Gait belt Activity Tolerance: Patient limited by fatigue;Patient limited by pain Patient left: in bed;with call bell/phone within reach;with family/visitor present;with SCD's reapplied     Time: 1323-1403 PT Time Calculation (min) (ACUTE ONLY): 40 min  Charges:    1 gait, 1 TE, 1 TA                   G Codes:     [place], [place] office Arelia Sneddon 07/03/2015, 4:28 PM

## 2015-07-03 NOTE — Evaluation (Signed)
Occupational Therapy Evaluation Patient Details Name: [name] MRN: [id number] DOB: [birth year] Today's Date: 07/03/2015    History of Present Illness Pt is a 82 y.o. male admitted on 06/30/15 for R femoral neck fx. Pt had IM nail surgery on 07/01/15. Pt has other significant PMH of bladder cancer, BPH, GERD, DVT, and anxiety.   Clinical Impression   Patient is s/p R IM nail surgery resulting in functional limitations due to the deficits listed below (see OT problem list). PTA using RW and wc with wife (A) for adls due to balance deficits. Patient will benefit from skilled OT acutely to increase independence and safety with ADLS to allow discharge SNF.     Follow Up Recommendations  SNF    Equipment Recommendations  Wheelchair cushion (measurements OT);Wheelchair (measurements OT);3 in 1 bedside comode;Hospital bed    Recommendations for Other Services       Precautions / Restrictions Precautions Precautions: Fall Precaution Comments: Pt has a history of falls.  Restrictions Weight Bearing Restrictions: Yes RLE Weight Bearing: Weight bearing as tolerated      Mobility Bed Mobility Overal bed mobility: Needs Assistance Bed Mobility: Supine to Sit;Rolling Rolling: Mod assist   Supine to sit: Mod assist     General bed mobility comments: Pt using bed rail and needing mod cues to help sequence task. pt pushing up on L side of bed and progressing to static sitting. pt with immediate LOB at EOB sitting   Transfers Overall transfer level: Needs assistance Equipment used: Rolling walker (2 wheeled) Transfers: Sit to/from Stand Sit to Stand: +2 physical assistance;Mod assist         General transfer comment: Pt R LE required (A) to weight shift L and (A) to sequence task. Pt with scissor gait with attempting to take step forward    Balance Overall balance assessment: Needs assistance Sitting-balance support: Bilateral upper extremity supported;Feet  supported Sitting balance-Leahy Scale: Zero   Postural control: Posterior lean Standing balance support: Bilateral upper extremity supported;During functional activity Standing balance-Leahy Scale: Zero                              ADL Overall ADL's : Needs assistance/impaired                         Toilet Transfer: +2 for physical assistance;Moderate assistance;Stand-pivot;RW;BSC Toilet Transfer Details (indicate cue type and reason): pt leaning to the R side           General ADL Comments: Pt requires total +2 mod (A) with stand pivot to the chair to the Left. pt leaning strongly to the R and scissoring with R LE with stepping     Vision     Perception     Praxis      Pertinent Vitals/Pain Pain Assessment: Faces Faces Pain Scale: Hurts little more Pain Location: r hip guarding Pain Descriptors / Indicators: Guarding Pain Intervention(s): Repositioned;Premedicated before session;Monitored during session     Hand Dominance Right   Extremity/Trunk Assessment Upper Extremity Assessment Upper Extremity Assessment: Generalized weakness   Lower Extremity Assessment Lower Extremity Assessment: Defer to PT evaluation   Cervical / Trunk Assessment Cervical / Trunk Assessment: Kyphotic   Communication Communication Communication: No difficulties   Cognition Arousal/Alertness: Awake/alert Behavior During Therapy: WFL for tasks assessed/performed;Anxious Overall Cognitive Status: Impaired/Different from baseline Area of Impairment: Awareness  Awareness: Emergent   General Comments: Pt reports children 4 and son present and holds up 2 fingers. Pt redirected and states "oh not grandchildren? I have bad memory i have to count them  1 2 3 4 5 6  "   General Comments       Exercises       Shoulder Instructions      Home Living Family/patient expects to be discharged to:: Private residence Living Arrangements: Spouse/significant  other Available Help at Discharge: Family Type of Home: House Home Access: Stairs to enter Technical brewer of Steps: 1 Entrance Stairs-Rails: None Home Layout: One level     Bathroom Shower/Tub: Occupational psychologist: Handicapped height Bathroom Accessibility: Yes   [facility] bed;Walker - 2 wheels;Grab bars - tub/shower;Shower seat - built in;Shower seat;Bedside commode;Wheelchair - manual   Additional Comments: Pt uses urinal at bedside if he wakes in the night while leaning against the bed for support. Pt reports being unsteady.      Prior Functioning/Environment Level of Independence: Needs assistance  Gait / Transfers Assistance Needed: Pt always uses a RW, and uses a wheelchair whenever he went out. At the end of the day, family reports his legs will buckle, and he required someone to physically hold on to him. ADL's / Homemaking Assistance Needed: Pt reports his wife helps him with sponge baths.    Comments: Pt used wheelchair if needed while out, but always used a RW.     OT Diagnosis: Generalized weakness;Acute pain   OT Problem List: Decreased strength;Decreased activity tolerance;Impaired balance (sitting and/or standing);Decreased safety awareness;Decreased knowledge of use of DME or AE;Decreased knowledge of precautions;Pain   OT Treatment/Interventions: Self-care/ADL training;Therapeutic exercise;Energy conservation;DME and/or AE instruction;Therapeutic activities;Patient/family education;Balance training    OT Goals(Current goals can be found in the care plan section) Acute Rehab OT Goals Patient Stated Goal: none stated OT Goal Formulation: With patient Time For Goal Achievement: 07/17/15 Potential to Achieve Goals: Good  OT Frequency: Min 2X/week   Barriers to D/C: Decreased caregiver support  wife with heart attack recently and primary care giver       Co-evaluation              End of Session Equipment Utilized  During Treatment: Gait belt;Rolling walker Nurse Communication: Mobility status;Precautions  Activity Tolerance: Patient tolerated treatment well Patient left: in chair;with call bell/phone within reach;with nursing/sitter in room   Time: 1035-1055 OT Time Calculation (min): 20 min Charges:  OT General Charges $OT Visit: 1 Procedure OT Evaluation $OT Eval High Complexity: 1 Procedure G-Codes:    Parke Poisson B 2015/08/01, 11:40 AM  Jeri Modena   OTR/L [id number] Office: [phone] .

## 2015-07-03 NOTE — Clinical Social Work Placement (Signed)
   CLINICAL SOCIAL WORK PLACEMENT  NOTE  Date:  07/03/2015  Patient Details  Name: [name] MRN: [id number] Date of Birth: [birth year]  Clinical Social Work is seeking post-discharge placement for this patient at the [place] level of care (*CSW will initial, date and re-position this form in  chart as items are completed):  Yes   Patient/family provided with [facility]'s list of facilities offering this level of care within the geographic area requested by the patient (or if unable, by the patient's family).  Yes   Patient/family informed of their freedom to choose among providers that offer the needed level of care, that participate in Medicare, Medicaid or managed care program needed by the patient, have an available bed and are willing to accept the patient.  Yes   Patient/family informed of [facility]'s ownership interest in [facility] and [facility], as well as of the fact that they are under no obligation to receive care at these facilities.  PASRR submitted to EDS on 07/03/15     PASRR number received on 07/03/15     Existing PASRR number confirmed on       FL2 transmitted to all facilities in geographic area requested by pt/family on 07/03/15     FL2 transmitted to all facilities within larger geographic area on       Patient informed that his/her managed care company has contracts with or will negotiate with certain facilities, including the following:            Patient/family informed of bed offers received.  Patient chooses bed at       Physician recommends and patient chooses bed at      Patient to be transferred to   on  .  Patient to be transferred to facility by       Patient family notified on   of transfer.  Name of family member notified:        PHYSICIAN Please prepare priority discharge summary, including medications, Please prepare prescriptions, Please sign FL2     Additional  Comment:    _______________________________________________ Liz Beach MSW, [place], [place], [id number]

## 2015-07-03 NOTE — NC FL2 (Signed)
[place] LEVEL OF CARE SCREENING TOOL     IDENTIFICATION  Patient Name: [name] Birthdate: [birth year] Sex: male Admission Date (Current Location): 06/30/2015  [facility] and [place] Number:  [employer] and Address:  [facility], [place] [address], [place], [place] [address]      Provider Number: [id number]  Attending Physician Name and Address:  Annita Brod, MD  Relative Name and Phone Number:       Current Level of Care: Hospital Recommended Level of Care: [place] Prior Approval Number:    Date Approved/Denied:   PASRR Number: [id number]  Discharge Plan: SNF    Current Diagnoses: Patient Active Problem List   Diagnosis Date Noted  . BPH (benign prostatic hyperplasia) 07/01/2015  . GERD (gastroesophageal reflux disease) 07/01/2015  . Fall 07/01/2015  . Closed right hip fracture ([place]) 06/30/2015  . Bradycardia 07/02/2014  . Fatigue 07/02/2014    Orientation RESPIRATION BLADDER Height & Weight    Self  Normal Continent 5\' 6"  (167.6 cm) 149 lbs.  BEHAVIORAL SYMPTOMS/MOOD NEUROLOGICAL BOWEL NUTRITION STATUS   (NONE)  (NONE) Continent Diet (Regular diet.)  AMBULATORY STATUS COMMUNICATION OF NEEDS Skin   Extensive Assist Verbally Surgical wounds (Incision right hip.)                       Personal Care Assistance Level of Assistance  Bathing, Dressing Bathing Assistance: Limited assistance   Dressing Assistance: Limited assistance     Functional Limitations Info  Sight, Hearing, Speech Sight Info: Impaired Hearing Info: Impaired Speech Info: Adequate    SPECIAL CARE FACTORS FREQUENCY  PT (By licensed PT), OT (By licensed OT)     PT Frequency: 5/week OT Frequency: 5/week            Contractures Contractures Info: Not present    Additional Factors Info  Allergies, Code Status Code Status Info: Full Allergies Info: Cephalosporins, Lovenox, Penicillins, Sulfa  Antibiotics           Current Medications (07/03/2015):  This is the current hospital active medication list Current Facility-Administered Medications  Medication Dose Route Frequency Provider Last Rate Last Dose  . 0.9 %  sodium chloride infusion   Intravenous Continuous Theressa Millard, MD 75 mL/hr at 07/01/15 1059    . 0.9 %  sodium chloride infusion   Intravenous Continuous Mcarthur Rossetti, MD 75 mL/hr at 07/03/15 0100    . acetaminophen (TYLENOL) tablet 650 mg  650 mg Oral Q6H PRN Mcarthur Rossetti, MD       Or  . acetaminophen (TYLENOL) suppository 650 mg  650 mg Rectal Q6H PRN Mcarthur Rossetti, MD      . ALPRAZolam Duanne Moron) tablet 0.5 mg  0.5 mg Oral TID PRN Theressa Millard, MD   0.5 mg at 07/01/15 2215  . alum & mag hydroxide-simeth (MAALOX/MYLANTA) 200-200-20 MG/5ML suspension 30 mL  30 mL Oral Q6H PRN Theressa Millard, MD      . aspirin EC tablet 325 mg  325 mg Oral Q breakfast Mcarthur Rossetti, MD   325 mg at 07/03/15 0755  . HYDROcodone-acetaminophen (NORCO/VICODIN) 5-325 MG per tablet 1-2 tablet  1-2 tablet Oral Q6H PRN Mcarthur Rossetti, MD   2 tablet at 07/02/15 1700  . HYDROmorphone (DILAUDID) injection 0.5-1 mg  0.5-1 mg Intravenous Q3H PRN Theressa Millard, MD   1 mg at 07/02/15 0107  . lactated ringers infusion  Intravenous Continuous Mcarthur Rossetti, MD   Stopped at 07/01/15 2145  . menthol-cetylpyridinium (CEPACOL) lozenge 3 mg  1 lozenge Oral PRN Mcarthur Rossetti, MD       Or  . phenol (CHLORASEPTIC) mouth spray 1 spray  1 spray Mouth/Throat PRN Mcarthur Rossetti, MD      . methocarbamol (ROBAXIN) tablet 500 mg  500 mg Oral Q6H PRN Mcarthur Rossetti, MD   500 mg at 07/02/15 0249   Or  . methocarbamol (ROBAXIN) 500 mg in dextrose 5 % 50 mL IVPB  500 mg Intravenous Q6H PRN Mcarthur Rossetti, MD      . metoCLOPramide (REGLAN) tablet 5-10 mg  5-10 mg Oral Q8H PRN Mcarthur Rossetti, MD       Or  .  metoCLOPramide (REGLAN) injection 5-10 mg  5-10 mg Intravenous Q8H PRN Mcarthur Rossetti, MD      . morphine 2 MG/ML injection 0.5 mg  0.5 mg Intravenous Q2H PRN Mcarthur Rossetti, MD      . ondansetron [facility]) tablet 4 mg  4 mg Oral Q6H PRN Mcarthur Rossetti, MD       Or  . ondansetron [facility]) injection 4 mg  4 mg Intravenous Q6H PRN Mcarthur Rossetti, MD      . oxyCODONE (Oxy IR/ROXICODONE) immediate release tablet 5 mg  5 mg Oral Q4H PRN Theressa Millard, MD      . pantoprazole (PROTONIX) EC tablet 40 mg  40 mg Oral Daily Eudelia Bunch, RPH   40 mg at 07/02/15 [id number]  . tamsulosin (FLOMAX) capsule 0.4 mg  0.4 mg Oral Daily Theressa Millard, MD   0.4 mg at 07/02/15 [id number]     Discharge Medications: Please see discharge summary for a list of discharge medications.  Relevant Imaging Results:  Relevant Lab Results:   Additional Information SSN: [id number]  Liz Beach MSW, [place], [place], [id number]

## 2015-07-03 NOTE — Progress Notes (Signed)
Patient ID: [name], male   DOB: 30-Nov-1926, 82 y.o.   MRN: [id number] No acute changes.  Labs and vitals stable.  Slow progress with PT on POD#1.  Recommending short-term SNF placement thus far.

## 2015-07-03 NOTE — Clinical Social Work Note (Signed)
Clinical Social Work Assessment  Patient Details  Name: [name] MRN: [id number] Date of Birth: 06/16/1927  Date of referral:  07/03/15               Reason for consult:  Facility Placement, Discharge Planning                Permission sought to share information with:  Facility Sport and exercise psychologist, Family Supports Permission granted to share information::  Yes, Verbal Permission Granted  Name::     Henrene Pastor and Risk manager::  SNFs  Relationship::     Contact Information:     Housing/Transportation Living arrangements for the past 2 months:  Single Family Home Source of Information:  Adult Children, Spouse Patient Interpreter Needed:  None Criminal Activity/Legal Involvement Pertinent to Current Situation/Hospitalization:  No - Comment as needed Significant Relationships:  Adult Children Lives with:  Spouse Do you feel safe going back to the place where you live?  Yes Need for family participation in patient care:  Yes (Comment)  Care giving concerns:  Patient's family agrees with recommendation for SNF placement and states that the patient will likely need long term care.   Social Worker assessment / plan:  CSW spoke with patient's spouse and children to complete assessment. Family states that they agree with the recommendation for SNF. Patient is from home with wife where family had difficulty meeting the patient's needs. CSW explained SNF search/placement process and answered the family's questions. Family prefers [facility]. CSW will followup with bed offers.  Employment status:  Retired Forensic scientist:  Medicare PT Recommendations:  [place] / Referral to community resources:  [place]  Patient/Family's Response to care:  Patient's family appears happy with the care the patient has received.  Patient/Family's Understanding of and Emotional Response to Diagnosis, Current Treatment, and Prognosis: Family  appears to have a good understanding of the reason for patient's admission and diagnosis. Family understands the patient's post DC needs.   Emotional Assessment Appearance:  Appears stated age Attitude/Demeanor/Rapport:  Unable to Assess (Patient confused.) Affect (typically observed):  Unable to Assess (Patient confused.) Orientation:  Oriented to Self Alcohol / Substance use:  Never Used Psych involvement (Current and /or in the community):  No (Comment)  Discharge Needs  Concerns to be addressed:  Discharge Planning Concerns Readmission within the last 30 days:  No Current discharge risk:  Chronically ill, Cognitively Impaired, Physical Impairment Barriers to Discharge:  Continued Medical Work up   [employer] MSW, [place], [place], [id number]

## 2015-07-03 NOTE — Care Management Important Message (Signed)
Important Message  Patient Details  Name: [name] MRN: [id number] Date of Birth: 08-18-1926   Medicare Important Message Given:  Yes    [name] 07/03/2015, 12:37 PM

## 2015-07-03 NOTE — Progress Notes (Signed)
Patient discharged to [facility] via [facility], report called to nurse Marijo File, family was at bedside at time of transfer.

## 2015-07-03 NOTE — Discharge Summary (Signed)
Discharge Summary  [name] [id number] DOB: January 07, 1927  PCP: Glo Herring., MD  Admit date: 06/30/2015 Discharge date: 07/03/2015  Time spent: 25 minutes   Recommendations for Outpatient Follow-up:  1. New medication: Aspirin 325 mg by mouth daily 30 days 2. Vicodin 5/325 by mouth 1-2 every 4 hours as needed for pain  Discharge Diagnoses:  Active Hospital Problems   Diagnosis Date Noted  . Closed right hip fracture ([place]) 06/30/2015  . BPH (benign prostatic hyperplasia) 07/01/2015  . GERD (gastroesophageal reflux disease) 07/01/2015  . Fall 07/01/2015    Resolved Hospital Problems   Diagnosis Date Noted Date Resolved  No resolved problems to display.    Discharge Condition: Improved, being discharged to skilled nursing facility   Diet recommendation: Regular diet   Filed Weights   06/30/15 2355  Weight: 67.8 kg (149 lb 7.6 oz)    History of present illness:  82 year old male with past medical history of bladder cancer, BPH and GERD came to the emergency room on 1/8 evening after a mechanical fall landing on his right hip after coming out of the bathroom and unable to stand. He was found to have a closed right femoral neck fracture on x-ray. Patient was admitted to the hospitalist service and orthopedic surgery was consulted.  Hospital Course:  Principal Problem:   Closed right hip fracture [facility]): Patient seen by orthopedic surgery and underwent IM nail of the right hip with screw placement done 1/9 without complications. Patient monitored for several days with no difficult blood loss anemia. Evaluated by PT who recommended short-term skilled nursing. Patient felt to be medically stable for discharge to skilled nursing on 1/11. He's been put on aspirin 325 mg by mouth daily 30 days for DVT prophylaxis. Activity is weight weightbearing as tolerated for right hip, up with therapy.  Active Problems:   BPH (benign prostatic hyperplasia): Continue Flomax    GERD  (gastroesophageal reflux disease): Stable continue on PPI    Fall: Balance should improve with skilled nursing PT   constipation: Patient normally source from chronic constipation. Amplified by being on pain medications during hospitalization. Prior to discharge, patient received Colace and Fleet enema for bowel movement.  Procedures:  Intramedullary nail with hip screw placement of the right hip   Consultations:  Orthopedic surgery   Discharge Exam: BP 145/73 mmHg  Pulse 77  Temp(Src) 98.2 F (36.8 C) (Oral)  Resp 16  Ht 5\' 6"  (1.676 m)  Wt 67.8 kg (149 lb 7.6 oz)  BMI 24.14 kg/m2  SpO2 92%  General: Alert and oriented 2, no acute distress Cardiovascular: Regular rate and rhythm, S1-S2  Respiratory: Clear to auscultation bilaterally   Discharge Instructions You were cared for by a hospitalist during your hospital stay. If you have any questions about your discharge medications or the care you received while you were in the hospital after you are discharged, you can call the unit and asked to speak with the hospitalist on call if the hospitalist that took care of you is not available. Once you are discharged, your primary care physician will handle any further medical issues. Please note that NO REFILLS for any discharge medications will be authorized once you are discharged, as it is imperative that you return to your primary care physician (or establish a relationship with a primary care physician if you do not have one) for your aftercare needs so that they can reassess your need for medications and monitor your lab values.  Discharge Instructions  Diet - low sodium heart healthy    Complete by:  As directed      Full weight bearing    Complete by:  As directed      Increase activity slowly    Complete by:  As directed             Medication List    STOP taking these medications        azithromycin 250 MG tablet  Commonly known as:  ZITHROMAX      TAKE these  medications        acetaminophen 500 MG tablet  Commonly known as:  TYLENOL  Take 500 mg by mouth every 6 (six) hours as needed for pain.     ALPRAZolam 0.5 MG tablet  Commonly known as:  XANAX  Take 0.5 mg by mouth 3 (three) times daily as needed for anxiety or sleep.     aspirin 325 MG EC tablet  Take 1 tablet (325 mg total) by mouth daily with breakfast.     bisacodyl 5 MG EC tablet  Commonly known as:  DULCOLAX  Take 5 mg by mouth daily as needed for moderate constipation.     diphenhydrAMINE 25 MG tablet  Commonly known as:  BENADRYL  Take 25 mg by mouth every 6 (six) hours as needed for allergies.     fluticasone 50 MCG/ACT nasal spray  Commonly known as:  FLONASE  Place 2 sprays into both nostrils as needed for allergies or rhinitis.     HYDROcodone-acetaminophen 5-325 MG tablet  Commonly known as:  NORCO/VICODIN  Take 1-2 tablets by mouth every 4 (four) hours as needed for moderate pain.     tamsulosin 0.4 MG Caps capsule  Commonly known as:  FLOMAX  Take 0.4 mg by mouth daily.       Allergies  Allergen Reactions  . Cephalosporins     unknown  . Lovenox [Enoxaparin]     Severe nausea and vomiting   . Penicillins Swelling    Has patient had a PCN reaction causing immediate rash, facial/tongue/throat swelling, SOB or lightheadedness with hypotension: unknown Has patient had a PCN reaction causing severe rash involving mucus membranes or skin necrosis: unknown Has patient had a PCN reaction that required hospitalization : unknown Has patient had a PCN reaction occurring within the last 10 years: unknown If all of the above answers are "NO", then may proceed with Cephalosporin use.   . Sulfa Antibiotics Hives and Rash       Follow-up Information    Follow up with Mcarthur Rossetti, MD. Schedule an appointment as soon as possible for a visit in 2 weeks.   Specialty:  Orthopedic Surgery   Contact information:   [address] [place]  [phone]        The results of significant diagnostics from this hospitalization (including imaging, microbiology, ancillary and laboratory) are listed below for reference.    Significant Diagnostic Studies: Dg C-arm 1-60 Min  07/01/2015  CLINICAL DATA:  82 year old male status post right femoral nail ORIF. EXAM: RIGHT FEMUR 2 VIEWS; DG C-ARM 61-120 MIN COMPARISON:  Right femur radiograph 06/30/2015. FINDINGS: Six intraoperative radiographs document interval placement of a long stem gamma nail fixation device traversing the previously noted femoral neck fracture, with restoration of anatomic alignment. Femoral head is located in the acetabulum. No acute complicating features. IMPRESSION: 1. Intraoperative documentation of gamma nail fixation for right femoral neck fracture, as above. Electronically Signed   By:  Vinnie Langton M.D.   On: 07/01/2015 20:35   Dg Hip Unilat With Pelvis 2-3 Views Right  06/30/2015  CLINICAL DATA:  Fall while walking with a walker, now with right hip pain. EXAM: DG HIP (WITH OR WITHOUT PELVIS) 2-3V RIGHT COMPARISON:  None. FINDINGS: Displaced angulated fracture of the right femoral neck with mild proximal shortening of the femoral shaft. Femoral head remains located. No additional fracture of the bony pelvis. Vascular calcifications are seen. Surgical clips project over the left inguinal region. IMPRESSION: Displaced angulated right femoral neck fracture. Electronically Signed   By: Jeb Levering M.D.   On: 06/30/2015 20:02   Dg Femur, Min 2 Views Right  07/01/2015  CLINICAL DATA:  82 year old male status post right femoral nail ORIF. EXAM: RIGHT FEMUR 2 VIEWS; DG C-ARM 61-120 MIN COMPARISON:  Right femur radiograph 06/30/2015. FINDINGS: Six intraoperative radiographs document interval placement of a long stem gamma nail fixation device traversing the previously noted femoral neck fracture, with restoration of anatomic alignment. Femoral head is located in the  acetabulum. No acute complicating features. IMPRESSION: 1. Intraoperative documentation of gamma nail fixation for right femoral neck fracture, as above. Electronically Signed   By: Vinnie Langton M.D.   On: 07/01/2015 20:35   Dg Femur, Min 2 Views Right  06/30/2015  CLINICAL DATA:  Right leg pain after fall while walking with a walker. EXAM: RIGHT FEMUR 2 VIEWS COMPARISON:  None. FINDINGS: Angulated femoral neck fracture with proximal migration of the femoral shaft. Distal femur is intact. No additional acute fracture. Knee alignment is grossly maintained. IMPRESSION: Femoral neck fracture.  Distal femur is intact. Electronically Signed   By: Jeb Levering M.D.   On: 06/30/2015 20:06    Microbiology: Recent Results (from the past 240 hour(s))  Surgical pcr screen     Status: None   Collection Time: 07/01/15  4:41 AM  Result Value Ref Range Status   MRSA, PCR NEGATIVE NEGATIVE Final   Staphylococcus aureus NEGATIVE NEGATIVE Final    Comment:        The Xpert SA Assay (FDA approved for NASAL specimens in patients over 15 years of age), is one component of a comprehensive surveillance program.  Test performance has been validated by [facility] for patients greater than or equal to 72 year old. It is not intended to diagnose infection nor to guide or monitor treatment.      Labs: Basic Metabolic Panel:  Recent Labs Lab 06/30/15 1910 07/01/15 0425 07/02/15 0354 07/03/15 0412  NA 137 137 136 135  K 4.0 4.2 4.1 3.5  CL 105 105 103 104  CO2 21* 23 23 24   GLUCOSE 127* 131* 125* 91  BUN 10 10 9 6   CREATININE 0.80 0.75 0.76 0.59*  CALCIUM 9.3 8.5* 8.4* 8.1*   Liver Function Tests:  Recent Labs Lab 06/30/15 1910  AST 24  ALT 7*  ALKPHOS 80  BILITOT 1.0  PROT 6.2*  ALBUMIN 3.8   No results for input(s): LIPASE, AMYLASE in the last 168 hours. No results for input(s): AMMONIA in the last 168 hours. CBC:  Recent Labs Lab 06/30/15 1910 07/01/15 0425  07/02/15 0354 07/03/15 0412  WBC 9.7 9.9 7.6 6.1  NEUTROABS 7.4  --   --   --   HGB 14.1 12.4* 11.6* 11.1*  HCT 41.5 37.6* 35.6* 33.0*  MCV 95.8 96.9 97.5 95.4  PLT 202 178 158 150   Cardiac Enzymes: No results for input(s): CKTOTAL, CKMB, CKMBINDEX, TROPONINI in  the last 168 hours. BNP: BNP (last 3 results) No results for input(s): BNP in the last 8760 hours.  ProBNP (last 3 results) No results for input(s): PROBNP in the last 8760 hours.  CBG: No results for input(s): GLUCAP in the last 168 hours.     Signed:  Annita Brod  [facility] 07/03/2015, 2:17 PM

## 2015-07-03 NOTE — Clinical Social Work Placement (Signed)
   CLINICAL SOCIAL WORK PLACEMENT  NOTE  Date:  07/03/2015  Patient Details  Name: [name] MRN: [id number] Date of Birth: Jul 01, 1926  Clinical Social Work is seeking post-discharge placement for this patient at the [place] level of care (*CSW will initial, date and re-position this form in  chart as items are completed):  Yes   Patient/family provided with [place] Work Department's list of facilities offering this level of care within the geographic area requested by the patient (or if unable, by the patient's family).  Yes   Patient/family informed of their freedom to choose among providers that offer the needed level of care, that participate in Medicare, Medicaid or managed care program needed by the patient, have an available bed and are willing to accept the patient.  Yes   Patient/family informed of 's ownership interest in [facility] and [facility], as well as of the fact that they are under no obligation to receive care at these facilities.  PASRR submitted to EDS on 07/03/15     PASRR number received on 07/03/15     Existing PASRR number confirmed on       FL2 transmitted to all facilities in geographic area requested by pt/family on 07/03/15     FL2 transmitted to all facilities within larger geographic area on       Patient informed that his/her managed care company has contracts with or will negotiate with certain facilities, including the following:        Yes   Patient/family informed of bed offers received.  Patient chooses bed at [facility]     Physician recommends and patient chooses bed at      Patient to be transferred [facility] on 07/03/15.  Patient to be transferred to facility by Ambulance     Patient family notified on 07/03/15 of transfer.  Name of family member notified:  Henrene Pastor at bedside     PHYSICIAN Please prepare priority discharge summary, including  medications, Please prepare prescriptions, Please sign FL2     Additional Comment:   Per MD patient ready for DC to [facility]. RN, patient, patient's family, and facility notified of DC. RN given number for report. DC packet on chart. Ambulance transport requested for patient for next available pickup. CSW signing off.  _______________________________________________ Liz Beach MSW, [place], [place], [id number]

## 2015-07-04 ENCOUNTER — Other Ambulatory Visit: Payer: Self-pay | Admitting: *Deleted

## 2015-07-04 MED ORDER — HYDROCODONE-ACETAMINOPHEN 5-325 MG PO TABS: ORAL_TABLET | ORAL | Status: DC

## 2015-07-04 MED ORDER — ALPRAZOLAM 0.5 MG PO TABS: 0.5000 mg | ORAL_TABLET | Freq: Three times a day (TID) | ORAL | Status: DC | PRN

## 2015-07-06 ENCOUNTER — Encounter (HOSPITAL_COMMUNITY)
Admission: RE | Admit: 2015-07-06 | Discharge: 2015-07-06 | Disposition: A | Discharge status: Home / Self Care | Payer: Medicare Other | Source: Skilled Nursing Facility | Attending: Internal Medicine | Admitting: Internal Medicine

## 2015-07-06 DIAGNOSIS — N390 Urinary tract infection, site not specified: Secondary | ICD-10-CM | POA: Insufficient documentation

## 2015-07-06 DIAGNOSIS — I10 Essential (primary) hypertension: Secondary | ICD-10-CM | POA: Insufficient documentation

## 2015-07-06 LAB — URINALYSIS, ROUTINE W REFLEX MICROSCOPIC
BILIRUBIN URINE: NEGATIVE
Glucose, UA: NEGATIVE mg/dL
Hgb urine dipstick: NEGATIVE
KETONES UR: NEGATIVE mg/dL
Leukocytes, UA: NEGATIVE
NITRITE: NEGATIVE
Protein, ur: NEGATIVE mg/dL
Specific Gravity, Urine: <1.005 — ABNORMAL LOW (ref 1.005–1.030)
pH: 7 (ref 5.0–8.0)

## 2015-07-07 ENCOUNTER — Non-Acute Institutional Stay (SKILLED_NURSING_FACILITY): Payer: Medicare Other | Admitting: Internal Medicine

## 2015-07-07 DIAGNOSIS — S72141A Displaced intertrochanteric fracture of right femur, initial encounter for closed fracture: Secondary | ICD-10-CM

## 2015-07-07 DIAGNOSIS — R260 Ataxic gait: Secondary | ICD-10-CM

## 2015-07-07 DIAGNOSIS — K219 Gastro-esophageal reflux disease without esophagitis: Secondary | ICD-10-CM

## 2015-07-07 DIAGNOSIS — R350 Frequency of micturition: Secondary | ICD-10-CM

## 2015-07-07 DIAGNOSIS — R296 Repeated falls: Secondary | ICD-10-CM | POA: Diagnosis not present

## 2015-07-07 NOTE — Progress Notes (Signed)
Patient ID: [name], male   DOB: [birth year], 82 y.o.   MRN: [id number]  Facility; [facility] Chief complaint; admission to SNF post admit to [facility] from 1/8 to 07/03/15  History; this is an 82 year old man who fell after coming out of the bathroom fracturing his right hip. Patient underwent an ORIF of the right hip generally without complications. He tells me he walks with a walker at home but doesn't really do much activity. He has had 3 or 4 falls in the last 6 months. He also notes urinary frequency hesitancy and incontinence. He is already had a urine sent in the nursing home I think out of concern for his frequency and difficulty voiding. He appears to have a history of bladder cancer followed by urology. He is on Flomax  BMP Latest Ref Rng 07/03/2015 07/02/2015 07/01/2015  Glucose 65 - 99 mg/dL 91 125(H) 131(H)  BUN 6 - 20 mg/dL 6 9 10   Creatinine 0.61 - 1.24 mg/dL 0.59(L) 0.76 0.75  Sodium 135 - 145 mmol/L 135 136 137  Potassium 3.5 - 5.1 mmol/L 3.5 4.1 4.2  Chloride 101 - 111 mmol/L 104 103 105  CO2 22 - 32 mmol/L 24 23 23   Calcium 8.9 - 10.3 mg/dL 8.1(L) 8.4(L) 8.5(L)    CBC Latest Ref Rng 07/03/2015 07/02/2015 07/01/2015  WBC 4.0 - 10.5 K/uL 6.1 7.6 9.9  Hemoglobin 13.0 - 17.0 g/dL 11.1(L) 11.6(L) 12.4(L)  Hematocrit 39.0 - 52.0 % 33.0(L) 35.6(L) 37.6(L)  Platelets 150 - 400 K/uL 150 158 178    Past Medical History  Diagnosis Date  . Anxiety   . Bladder cancer ([place]) 2008  . Arthritis     Hands and ankles  . History of DVT (deep vein thrombosis) 2008  . BPH (benign prostatic hyperplasia)   . GERD (gastroesophageal reflux disease)   . Sleep apnea     Stop Bang score of 5   Past Surgical History  Procedure Laterality Date  . Prostate surgery  2004  . Bladder surgery  2008  . Inguinal hernia repair Left 09/05/2012    Procedure: HERNIA REPAIR INGUINAL ADULT;  Surgeon: Jamesetta So, MD;  Location: [facility];  Service: General;  Laterality: Left;  Left Inguinal  Herniorraphy  . Insertion of mesh Left 09/05/2012    Procedure: INSERTION OF MESH;  Surgeon: Jamesetta So, MD;  Location: [facility];  Service: General;  Laterality: Left;  . Transurethral resection of prostate N/A 05/16/2013    Procedure: TRANSURETHRAL RESECTION OF THE PROSTATE (TURP);  Surgeon: Marissa Nestle, MD;  Location: [facility];  Service: Urology;  Laterality: N/A;  . Femur im nail Right 07/01/2015    Procedure: INTRAMEDULLARY (IM) NAIL FEMORAL;  Surgeon: Mcarthur Rossetti, MD;  Location: [facility];  Service: Orthopedics;  Laterality: Right;    Current Outpatient Prescriptions on File Prior to Visit  Medication Sig Dispense Refill  . acetaminophen (TYLENOL) 500 MG tablet Take 500 mg by mouth every 6 (six) hours as needed for pain.    Marland Kitchen ALPRAZolam (XANAX) 0.5 MG tablet Take 1 tablet (0.5 mg total) by mouth 3 (three) times daily as needed for anxiety or sleep. 90 tablet 0  . aspirin EC 325 MG EC tablet Take 1 tablet (325 mg total) by mouth daily with breakfast. 30 tablet 0  . bisacodyl (DULCOLAX) 5 MG EC tablet Take 5 mg by mouth daily as needed for moderate constipation.     . diphenhydrAMINE (BENADRYL) 25 MG tablet Take 25  mg by mouth every 6 (six) hours as needed for allergies.    . fluticasone (FLONASE) 50 MCG/ACT nasal spray Place 2 sprays into both nostrils as needed for allergies or rhinitis.    Marland Kitchen HYDROcodone-acetaminophen (NORCO/VICODIN) 5-325 MG tablet Take 1 tablet by mouth every 4 hours as needed for moderate pain. 180 tablet 0  . tamsulosin (FLOMAX) 0.4 MG CAPS Take 0.4 mg by mouth daily.      Social; patient states he's lives in [place] with his wife. He uses a walker. His exact functional status is not clear although he seems to give a history of falling  reports that he quit smoking about 36 years ago. His smoking use included Cigarettes. He quit after 39 years of use. He does not have any smokeless tobacco history on file. He reports that he does not drink alcohol or use  illicit drugs.  Family history; none reported by the patient  Review of systems HEENT no complaints of oral difficulties or visual loss. Sounds as though he has significant postnasal drip Respiratory no shortness of breath Cardiac no chest pain GI no difficulty swallowing abdominal pain or diarrhea GU; no clear dysuria or hematuria however the patient states that he often will have the feeling of Having to void and then cannot. Musculoskeletal; he does not complain of neck or back pain Neurologic; no focal weakness although he states he is generally weak and tired. States he has gait problems uses a walker at home for the last 3 years Mental status; complains of memory loss  Physical examination Gen. pleasant man sitting up in his wheelchair Vitals; O2 sat 96% on room air pulse rate 66 respirations 18 and unlabored HEENT; arcus senilis oral exam reveals no oral lesions Respiratory; clear air entry bilaterally Cardiac; heart sounds are normal there are no murmurs euvolemic Abdomen; no liver no spleen no masses GU; no bladder distention or tenderness no CVA tenderness Extremities; mild edema and no signs of a DVT peripheral pulses are all Neurologic; cranial nerves seem normal. No pronator drift, no rigidity. Speech is somewhat dysarthric sounding although patient has not noticed any difficulties Mental status no evidence of depression or delirium. Conversation tends to wander likely some degree of underlying dementia  Impression/plan #1 status post right hip ORIF. Only on aspirin once a day for DVT prophylaxis. I am not completely sure of the utility of this in this setting #2 urinary difficulties. His urinalysis done in the facility looks clear. He has a history of BPH and bladder cancer and a lot of voiding complaints. There is not a lot of evidence at the bedside of urinary retention. Probably needs a bladder scan done this week #3 gait difficulties using a walker at home. By his  description he is reasonably immobile. I see no major lateralizing neurologic findings. Historically according to the patient he was not walking all that much and totally dependent on his wife #4 history of gastroesophageal reflux and what sounds like postnasal drip. There is no complaints of dysphagia #5 history of falls, according to the patient 3-4 times in the last 6 months #6 by review of [place] he has had bilateral inguinal hernias and nephrolithiasis. It appears that he had inguinal hernias repaired  Somewhat of a frail-looking man with a recent right hip fracture. His baseline functional status is unclear. I will do a bladder scan on him. His urine looks really fine in terms of urinalysis no evidence of infection no hematuria. Like a chance to have  a better look at his neurologic exam when he is supine

## 2015-07-08 LAB — URINE CULTURE

## 2015-07-09 ENCOUNTER — Encounter (HOSPITAL_COMMUNITY)
Admission: RE | Admit: 2015-07-09 | Discharge: 2015-07-09 | Disposition: A | Discharge status: Home / Self Care | Payer: Medicare Other | Source: Skilled Nursing Facility | Attending: Internal Medicine | Admitting: Internal Medicine

## 2015-07-09 DIAGNOSIS — D090 Carcinoma in situ of bladder: Secondary | ICD-10-CM | POA: Insufficient documentation

## 2015-07-09 LAB — URINALYSIS, ROUTINE W REFLEX MICROSCOPIC
BILIRUBIN URINE: NEGATIVE
GLUCOSE, UA: NEGATIVE mg/dL
HGB URINE DIPSTICK: NEGATIVE
Ketones, ur: NEGATIVE mg/dL
Leukocytes, UA: NEGATIVE
Nitrite: NEGATIVE
Protein, ur: NEGATIVE mg/dL
SPECIFIC GRAVITY, URINE: 1.01 (ref 1.005–1.030)
pH: 7 (ref 5.0–8.0)

## 2015-07-10 LAB — URINE CULTURE

## 2015-07-11 ENCOUNTER — Other Ambulatory Visit: Payer: Self-pay | Admitting: *Deleted

## 2015-07-11 MED ORDER — HYDROCODONE-ACETAMINOPHEN 5-325 MG PO TABS: ORAL_TABLET | ORAL | Status: DC

## 2015-07-11 NOTE — Telephone Encounter (Signed)
[facility] 

## 2015-08-07 ENCOUNTER — Inpatient Hospital Stay
Admission: RE | Admit: 2015-08-07 | Discharge: 2015-11-07 | Disposition: A | Discharge status: Home / Self Care | Payer: BLUE CROSS/BLUE SHIELD | Source: Ambulatory Visit | Attending: Internal Medicine | Admitting: Internal Medicine

## 2015-08-07 ENCOUNTER — Encounter (HOSPITAL_COMMUNITY): Payer: Self-pay | Admitting: Emergency Medicine

## 2015-08-07 ENCOUNTER — Emergency Department (HOSPITAL_COMMUNITY): Payer: Medicare Other

## 2015-08-07 ENCOUNTER — Emergency Department (HOSPITAL_COMMUNITY)
Admission: EM | Admit: 2015-08-07 | Discharge: 2015-08-07 | Disposition: A | Discharge status: Skilled Nursing Facility | Payer: Medicare Other | Attending: Emergency Medicine | Admitting: Emergency Medicine

## 2015-08-07 ENCOUNTER — Encounter (HOSPITAL_COMMUNITY)
Admission: AD | Admit: 2015-08-07 | Discharge: 2015-08-07 | Disposition: A | Discharge status: Home / Self Care | Payer: Medicare Other | Source: Skilled Nursing Facility | Attending: Internal Medicine | Admitting: Internal Medicine

## 2015-08-07 ENCOUNTER — Other Ambulatory Visit: Payer: Self-pay | Admitting: Internal Medicine

## 2015-08-07 ENCOUNTER — Non-Acute Institutional Stay (SKILLED_NURSING_FACILITY): Payer: Medicare Other | Admitting: Internal Medicine

## 2015-08-07 ENCOUNTER — Encounter: Payer: Self-pay | Admitting: Internal Medicine

## 2015-08-07 DIAGNOSIS — F419 Anxiety disorder, unspecified: Secondary | ICD-10-CM | POA: Diagnosis not present

## 2015-08-07 DIAGNOSIS — Z87891 Personal history of nicotine dependence: Secondary | ICD-10-CM | POA: Insufficient documentation

## 2015-08-07 DIAGNOSIS — Z23 Encounter for immunization: Secondary | ICD-10-CM | POA: Diagnosis not present

## 2015-08-07 DIAGNOSIS — R109 Unspecified abdominal pain: Secondary | ICD-10-CM

## 2015-08-07 DIAGNOSIS — S0101XA Laceration without foreign body of scalp, initial encounter: Secondary | ICD-10-CM

## 2015-08-07 DIAGNOSIS — G4730 Sleep apnea, unspecified: Secondary | ICD-10-CM | POA: Diagnosis not present

## 2015-08-07 DIAGNOSIS — Y9389 Activity, other specified: Secondary | ICD-10-CM | POA: Diagnosis not present

## 2015-08-07 DIAGNOSIS — S022XXD Fracture of nasal bones, subsequent encounter for fracture with routine healing: Secondary | ICD-10-CM | POA: Diagnosis not present

## 2015-08-07 DIAGNOSIS — Z8551 Personal history of malignant neoplasm of bladder: Secondary | ICD-10-CM | POA: Insufficient documentation

## 2015-08-07 DIAGNOSIS — Z79899 Other long term (current) drug therapy: Secondary | ICD-10-CM | POA: Insufficient documentation

## 2015-08-07 DIAGNOSIS — M1990 Unspecified osteoarthritis, unspecified site: Secondary | ICD-10-CM | POA: Insufficient documentation

## 2015-08-07 DIAGNOSIS — R609 Edema, unspecified: Secondary | ICD-10-CM | POA: Diagnosis not present

## 2015-08-07 DIAGNOSIS — N400 Enlarged prostate without lower urinary tract symptoms: Secondary | ICD-10-CM | POA: Insufficient documentation

## 2015-08-07 DIAGNOSIS — R059 Cough, unspecified: Principal | ICD-10-CM

## 2015-08-07 DIAGNOSIS — Z880 Allergy status to penicillin: Secondary | ICD-10-CM | POA: Insufficient documentation

## 2015-08-07 DIAGNOSIS — S022XXA Fracture of nasal bones, initial encounter for closed fracture: Secondary | ICD-10-CM | POA: Insufficient documentation

## 2015-08-07 DIAGNOSIS — W06XXXA Fall from bed, initial encounter: Secondary | ICD-10-CM | POA: Diagnosis not present

## 2015-08-07 DIAGNOSIS — Z9181 History of falling: Secondary | ICD-10-CM | POA: Insufficient documentation

## 2015-08-07 DIAGNOSIS — W19XXXA Unspecified fall, initial encounter: Secondary | ICD-10-CM

## 2015-08-07 DIAGNOSIS — K219 Gastro-esophageal reflux disease without esophagitis: Secondary | ICD-10-CM | POA: Diagnosis not present

## 2015-08-07 DIAGNOSIS — Z86718 Personal history of other venous thrombosis and embolism: Secondary | ICD-10-CM | POA: Diagnosis not present

## 2015-08-07 DIAGNOSIS — Y9289 Other specified places as the place of occurrence of the external cause: Secondary | ICD-10-CM | POA: Insufficient documentation

## 2015-08-07 DIAGNOSIS — S72001D Fracture of unspecified part of neck of right femur, subsequent encounter for closed fracture with routine healing: Secondary | ICD-10-CM | POA: Diagnosis not present

## 2015-08-07 DIAGNOSIS — W19XXXD Unspecified fall, subsequent encounter: Secondary | ICD-10-CM | POA: Diagnosis not present

## 2015-08-07 DIAGNOSIS — S0990XA Unspecified injury of head, initial encounter: Secondary | ICD-10-CM | POA: Diagnosis present

## 2015-08-07 DIAGNOSIS — R1084 Generalized abdominal pain: Secondary | ICD-10-CM

## 2015-08-07 DIAGNOSIS — R5383 Other fatigue: Secondary | ICD-10-CM | POA: Insufficient documentation

## 2015-08-07 DIAGNOSIS — Y998 Other external cause status: Secondary | ICD-10-CM | POA: Insufficient documentation

## 2015-08-07 DIAGNOSIS — S0181XA Laceration without foreign body of other part of head, initial encounter: Secondary | ICD-10-CM | POA: Insufficient documentation

## 2015-08-07 DIAGNOSIS — Z7982 Long term (current) use of aspirin: Secondary | ICD-10-CM | POA: Diagnosis not present

## 2015-08-07 DIAGNOSIS — R600 Localized edema: Secondary | ICD-10-CM

## 2015-08-07 DIAGNOSIS — M6281 Muscle weakness (generalized): Secondary | ICD-10-CM | POA: Insufficient documentation

## 2015-08-07 DIAGNOSIS — R05 Cough: Principal | ICD-10-CM

## 2015-08-07 DIAGNOSIS — R350 Frequency of micturition: Secondary | ICD-10-CM | POA: Insufficient documentation

## 2015-08-07 MED ORDER — TETANUS-DIPHTH-ACELL PERTUSSIS 5-2.5-18.5 LF-MCG/0.5 IM SUSP
0.5000 mL | Freq: Once | INTRAMUSCULAR | Status: AC
  Administered 2015-08-07: 0.5 mL via INTRAMUSCULAR
  Filled 2015-08-07: qty 0.5

## 2015-08-07 NOTE — ED Notes (Signed)
Pt fell getting out of bed, he has lac tot he forehead and dried blood in nose.

## 2015-08-07 NOTE — Discharge Instructions (Signed)
Patient was seen and evaluated. He has a laceration to the forehead. This was repaired with skin glue. CT head and neck show a possible nasal bone fracture. Otherwise, reassuring.  Fall Prevention in Hospitals, Adult As a hospital patient, your condition and the treatments you receive can increase your risk for falls. Some additional risk factors for falls in a hospital include:  Being in an unfamiliar environment.  Being on bed rest.  Your surgery.  Taking certain medicines.  Your tubing requirements, such as intravenous (IV) therapy or catheters. It is important that you learn how to decrease fall risks while at the hospital. Below are important tips that can help prevent falls. SAFETY TIPS FOR PREVENTING FALLS Talk about your risk of falling.  Ask your health care provider why you are at risk for falling. Is it your medicine, illness, tubing placement, or something else?  Make a plan with your health care provider to keep you safe from falls.  Ask your health care provider or pharmacist about side effects of your medicines. Some medicines can make you dizzy or affect your coordination. Ask for help.  Ask for help before getting out of bed. You may need to press your call button.  Ask for assistance in getting safely to the toilet.  Ask for a walker or cane to be put at your bedside. Ask that most of the side rails on your bed be placed up before your health care provider leaves the room.  Ask family or friends to sit with you.  Ask for things that are out of your reach, such as your glasses, hearing aids, telephone, bedside table, or call button. Follow these tips to avoid falling:  Stay lying or seated, rather than standing, while waiting for help.  Wear rubber-soled slippers or shoes whenever you walk in the hospital.  Avoid quick, sudden movements.  Change positions slowly.  Sit on the side of your bed before standing.  Stand up slowly and wait before you start to  walk.  Let your health care provider know if there is a spill on the floor.  Pay careful attention to the medical equipment, electrical cords, and tubes around you.  When you need help, use your call button by your bed or in the bathroom. Wait for one of your health care providers to help you.  If you feel dizzy or unsure of your footing, return to bed and wait for assistance.  Avoid being distracted by the TV, telephone, or another person in your room.  Do not lean or support yourself on rolling objects, such as IV poles or bedside tables.   This information is not intended to replace advice given to you by your health care provider. Make sure you discuss any questions you have with your health care provider.   Document Released: 06/05/2000 Document Revised: 06/29/2014 Document Reviewed: 02/14/2012 Elsevier Interactive Patient Education 2016 [employer] Facial Laceration  A facial laceration is a cut on the face. These injuries can be painful and cause bleeding. Lacerations usually heal quickly, but they need special care to reduce scarring. DIAGNOSIS  Your health care provider will take a medical history, ask for details about how the injury occurred, and examine the wound to determine how deep the cut is. TREATMENT  Some facial lacerations may not require closure. Others may not be able to be closed because of an increased risk of infection. The risk of infection and the chance for successful closure will depend on various factors, including  the amount of time since the injury occurred. The wound may be cleaned to help prevent infection. If closure is appropriate, pain medicines may be given if needed. Your health care provider will use stitches (sutures), wound glue (adhesive), or skin adhesive strips to repair the laceration. These tools bring the skin edges together to allow for faster healing and a better cosmetic outcome. If needed, you may also be given a tetanus shot. HOME CARE  INSTRUCTIONS  Only take over-the-counter or prescription medicines as directed by your health care provider.  Follow your health care provider's instructions for wound care. These instructions will vary depending on the technique used for closing the wound. For Sutures:  Keep the wound clean and dry.   If you were given a bandage (dressing), you should change it at least once a day. Also change the dressing if it becomes wet or dirty, or as directed by your health care provider.   Wash the wound with soap and water 2 times a day. Rinse the wound off with water to remove all soap. Pat the wound dry with a clean towel.   After cleaning, apply a thin layer of the antibiotic ointment recommended by your health care provider. This will help prevent infection and keep the dressing from sticking.   You may shower as usual after the first 24 hours. Do not soak the wound in water until the sutures are removed.   Get your sutures removed as directed by your health care provider. With facial lacerations, sutures should usually be taken out after 4-5 days to avoid stitch marks.   Wait a few days after your sutures are removed before applying any makeup. For Skin Adhesive Strips:  Keep the wound clean and dry.   Do not get the skin adhesive strips wet. You may bathe carefully, using caution to keep the wound dry.   If the wound gets wet, pat it dry with a clean towel.   Skin adhesive strips will fall off on their own. You may trim the strips as the wound heals. Do not remove skin adhesive strips that are still stuck to the wound. They will fall off in time.  For Wound Adhesive:  You may briefly wet your wound in the shower or bath. Do not soak or scrub the wound. Do not swim. Avoid periods of heavy sweating until the skin adhesive has fallen off on its own. After showering or bathing, gently pat the wound dry with a clean towel.   Do not apply liquid medicine, cream medicine, ointment  medicine, or makeup to your wound while the skin adhesive is in place. This may loosen the film before your wound is healed.   If a dressing is placed over the wound, be careful not to apply tape directly over the skin adhesive. This may cause the adhesive to be pulled off before the wound is healed.   Avoid prolonged exposure to sunlight or tanning lamps while the skin adhesive is in place.  The skin adhesive will usually remain in place for 5-10 days, then naturally fall off the skin. Do not pick at the adhesive film.  After Healing: Once the wound has healed, cover the wound with sunscreen during the day for 1 full year. This can help minimize scarring. Exposure to ultraviolet light in the first year will darken the scar. It can take 1-2 years for the scar to lose its redness and to heal completely.  SEEK MEDICAL CARE IF:  You have a  fever. SEEK IMMEDIATE MEDICAL CARE IF:  You have redness, pain, or swelling around the wound.   You see ayellowish-white fluid (pus) coming from the wound.    This information is not intended to replace advice given to you by your health care provider. Make sure you discuss any questions you have with your health care provider.   Document Released: 07/16/2004 Document Revised: 06/29/2014 Document Reviewed: 01/19/2013 Elsevier Interactive Patient Education [employer].

## 2015-08-07 NOTE — ED Provider Notes (Signed)
CSN: [id number]     Arrival date & time 08/07/15  0334 History   First MD Initiated Contact with Patient 08/07/15 [phone]     Chief Complaint  Patient presents with  . Fall     (Consider location/radiation/quality/duration/timing/severity/associated sxs/prior Treatment) HPI  This is an 82 year old male who presents following a fall. States that he was getting out of bed when he fell and hit his head. Denies loss of consciousness.  It is unclear what his baseline is. He states he sometimes walks with a walker. He is currently in a rehabilitation center. Multiple falls noted over the last 6 months based on chart review. Denies any anticoagulant use. It does. He is on aspirin daily.  Past Medical History  Diagnosis Date  . Anxiety   . Bladder cancer ([place]) 2008  . Arthritis     Hands and ankles  . History of DVT (deep vein thrombosis) 2008  . BPH (benign prostatic hyperplasia)   . GERD (gastroesophageal reflux disease)   . Sleep apnea     Stop Bang score of 5   Past Surgical History  Procedure Laterality Date  . Prostate surgery  2004  . Bladder surgery  2008  . Inguinal hernia repair Left 09/05/2012    Procedure: HERNIA REPAIR INGUINAL ADULT;  Surgeon: Jamesetta So, MD;  Location: [facility];  Service: General;  Laterality: Left;  Left Inguinal Herniorraphy  . Insertion of mesh Left 09/05/2012    Procedure: INSERTION OF MESH;  Surgeon: Jamesetta So, MD;  Location: [facility];  Service: General;  Laterality: Left;  . Transurethral resection of prostate N/A 05/16/2013    Procedure: TRANSURETHRAL RESECTION OF THE PROSTATE (TURP);  Surgeon: Marissa Nestle, MD;  Location: [facility];  Service: Urology;  Laterality: N/A;  . Femur im nail Right 07/01/2015    Procedure: INTRAMEDULLARY (IM) NAIL FEMORAL;  Surgeon: Mcarthur Rossetti, MD;  Location: [facility];  Service: Orthopedics;  Laterality: Right;   History reviewed. No pertinent family history. Social History  Substance Use Topics  . Smoking  status: Former Smoker -- 39 years    Types: Cigarettes    Quit date: 07/05/1979  . Smokeless tobacco: None  . Alcohol Use: No    Review of Systems  Constitutional: Negative for fever.  Respiratory: Negative for shortness of breath.   Cardiovascular: Negative for chest pain.  Genitourinary: Negative for dysuria.  Skin: Positive for wound.  Psychiatric/Behavioral: Negative for confusion.  All other systems reviewed and are negative.     Allergies  Cephalosporins; Lovenox; Penicillins; and Sulfa antibiotics  Home Medications   Prior to Admission medications   Medication Sig Start Date End Date Taking? Authorizing Provider  acetaminophen (TYLENOL) 500 MG tablet Take 500 mg by mouth every 6 (six) hours as needed for pain.   Yes Historical Provider, MD  ALPRAZolam Duanne Moron) 0.5 MG tablet Take 1 tablet (0.5 mg total) by mouth 3 (three) times daily as needed for anxiety or sleep. 07/04/15  Yes Tiffany L Reed, DO  aspirin EC 325 MG EC tablet Take 1 tablet (325 mg total) by mouth daily with breakfast. 07/02/15  Yes Mcarthur Rossetti, MD  bisacodyl (DULCOLAX) 5 MG EC tablet Take 5 mg by mouth daily as needed for moderate constipation.    Yes Historical Provider, MD  diphenhydrAMINE (BENADRYL) 25 MG tablet Take 25 mg by mouth every 6 (six) hours as needed for allergies.   Yes Historical Provider, MD  fluticasone (FLONASE) 50 MCG/ACT nasal spray Place  2 sprays into both nostrils as needed for allergies or rhinitis.   Yes Historical Provider, MD  HYDROcodone-acetaminophen (NORCO/VICODIN) 5-325 MG tablet Take 1-2 tablets by mouth every 4 hours as needed for moderate pain. Max APAP 3gm/24hrs from all sources 07/11/15  Yes Lauree Chandler, NP  omeprazole (PRILOSEC) 20 MG capsule Take 20 mg by mouth daily.   Yes Historical Provider, MD  tamsulosin (FLOMAX) 0.4 MG CAPS Take 0.4 mg by mouth daily.   Yes Historical Provider, MD   There were no vitals taken for this visit. Physical Exam   Constitutional: No distress.  Elderly, frail-appearing  HENT:  Head: Normocephalic.  2.5 cm laceration noted just superior to the right eyebrow, bleeding controlled, no bony abnormalities noted, dried blood noted in the bilateral nares, no septal hematoma, bilateral TMs without evidence of hemotympanum  Eyes: Pupils are equal, round, and reactive to light.  Neck: Normal range of motion. Neck supple.  No midline C-spine tenderness  Cardiovascular: Normal rate, regular rhythm and normal heart sounds.   No murmur heard. Pulmonary/Chest: Effort normal and breath sounds normal. No respiratory distress. He has no wheezes.  Abdominal: Soft. Bowel sounds are normal. There is no tenderness. There is no rebound.  Musculoskeletal:  Normal range of motion of bilateral hips, no pain with range of motion, no obvious deformities  Neurological: He is alert.  Oriented to self and place, not time  Skin: Skin is warm and dry.  Psychiatric: He has a normal mood and affect.  Nursing note and vitals reviewed.   ED Course  Procedures (including critical care time)  LACERATION REPAIR Performed by: Merryl Hacker Authorized by: Merryl Hacker Consent: Verbal consent obtained. Risks and benefits: risks, benefits and alternatives were discussed Consent given by: patient Patient identity confirmed: provided demographic data Prepped and Draped in normal sterile fashion Wound explored  Laceration Location: forhead  Laceration Length: 2.5 cm  No Foreign Bodies seen or palpated   Irrigation method: syringe Amount of cleaning: standard  Skin closure: dermabond and steristrips   Patient tolerance: Patient tolerated the procedure well with no immediate complications.  Labs Review Labs Reviewed - No data to display  Imaging Review Ct Head Wo Contrast  08/07/2015  CLINICAL DATA:  Patient fell getting out of bed. Laceration over the forehead. Blood in the nose EXAM: CT HEAD WITHOUT CONTRAST  CT CERVICAL SPINE WITHOUT CONTRAST TECHNIQUE: Multidetector CT imaging of the head and cervical spine was performed following the standard protocol without intravenous contrast. Multiplanar CT image reconstructions of the cervical spine were also generated. COMPARISON:  None. FINDINGS: CT HEAD FINDINGS Small subcutaneous scalp hematoma over the right anterior frontal region. Diffuse cerebral atrophy. Ventricular dilatation consistent with central atrophy. Low-attenuation changes in the deep white matter consistent with small vessel ischemia. No mass effect or midline shift. No abnormal extra-axial fluid collections. Gray-white matter junctions are distinct. Basal cisterns are not effaced. No evidence of acute intracranial hemorrhage. No depressed skull fractures. Opacification of some of the ethmoid air cells. Opacification of parts of the right mastoid air cells. Acute appearing nasal bone fractures with mild depression. CT CERVICAL SPINE FINDINGS Normal alignment of the cervical spine. Degenerative changes with narrowed interspaces and endplate hypertrophic changes most prominent at C4-5, C5-6, and C6-7 levels. No vertebral compression deformities. No prevertebral soft tissue swelling. C1-2 articulation appears intact. Degenerative changes throughout the facet joints. No focal bone lesion or bone destruction. Vascular calcifications. Prominent emphysematous changes and fibrosis in the lung apices. IMPRESSION:  No acute intracranial abnormalities. Subcutaneous scalp hematoma over the right anterior frontal region. Acute appearing nasal bone fractures. Normal alignment of the cervical spine. Degenerative changes. No acute displaced fractures identified. Electronically Signed   By: Lucienne Capers M.D.   On: 08/07/2015 04:57   Ct Cervical Spine Wo Contrast  08/07/2015  CLINICAL DATA:  Patient fell getting out of bed. Laceration over the forehead. Blood in the nose EXAM: CT HEAD WITHOUT CONTRAST CT CERVICAL SPINE  WITHOUT CONTRAST TECHNIQUE: Multidetector CT imaging of the head and cervical spine was performed following the standard protocol without intravenous contrast. Multiplanar CT image reconstructions of the cervical spine were also generated. COMPARISON:  None. FINDINGS: CT HEAD FINDINGS Small subcutaneous scalp hematoma over the right anterior frontal region. Diffuse cerebral atrophy. Ventricular dilatation consistent with central atrophy. Low-attenuation changes in the deep white matter consistent with small vessel ischemia. No mass effect or midline shift. No abnormal extra-axial fluid collections. Gray-white matter junctions are distinct. Basal cisterns are not effaced. No evidence of acute intracranial hemorrhage. No depressed skull fractures. Opacification of some of the ethmoid air cells. Opacification of parts of the right mastoid air cells. Acute appearing nasal bone fractures with mild depression. CT CERVICAL SPINE FINDINGS Normal alignment of the cervical spine. Degenerative changes with narrowed interspaces and endplate hypertrophic changes most prominent at C4-5, C5-6, and C6-7 levels. No vertebral compression deformities. No prevertebral soft tissue swelling. C1-2 articulation appears intact. Degenerative changes throughout the facet joints. No focal bone lesion or bone destruction. Vascular calcifications. Prominent emphysematous changes and fibrosis in the lung apices. IMPRESSION: No acute intracranial abnormalities. Subcutaneous scalp hematoma over the right anterior frontal region. Acute appearing nasal bone fractures. Normal alignment of the cervical spine. Degenerative changes. No acute displaced fractures identified. Electronically Signed   By: Lucienne Capers M.D.   On: 08/07/2015 04:57   I have personally reviewed and evaluated these images and lab results as part of my medical decision-making.   EKG Interpretation   Date/Time:  Wednesday August 07 2015 03:43:07 EST Ventricular Rate:   77 PR Interval:  37 QRS Duration: 95 QT Interval:  399 QTC Calculation: 452 R Axis:   105 Text Interpretation:  Unknown rhythm, irregular rate Short PR interval  Right atrial enlargement Anterior infarct, old Borderline ST depression,  lateral leads Artifact in lead(s) I II III aVR aVL aVF V1 V2 Poor quality  EKG, no obvious significant change Confirmed by Sonoma Firkus  MD, Artois  [id number]) on 08/07/2015 4:43:38 AM      MDM   Final diagnoses:  Fall, initial encounter  Scalp laceration, initial encounter  Nasal bone fracture, closed, initial encounter    Patient presents following a fall. Obvious scalp laceration. His functional baseline is unclear. It is also unclear of his normal mental status. He is only oriented 2. He does appear to be able to provide history and remembers the fall. EKG shows no evidence of arrhythmia. CT head and neck obtained shows no intracranial bleed. Does have evidence of a nasal bone fracture. These are closed without evidence of hematoma. Laceration was repaired at the bedside. Tetanus was updated. Will discharge back to facility.  After history, exam, and medical workup I feel the patient has been appropriately medically screened and is safe for discharge home. Pertinent diagnoses were discussed with the patient. Patient was given return precautions.     Merryl Hacker, MD 08/07/15 [phone]

## 2015-08-07 NOTE — Progress Notes (Signed)
Patient ID: [name], male   DOB: January 09, 1927, 82 y.o.   MRN: [id number]   This is an acute-routine visit.  Level care skilled.  Facility [facility].  Chief complaint-acute visit follow-up nasal fracture and contusion.  Medical management of chronic issues including right hip fracture status post repair BPH GERD   HiPI this is an 82 year old man who fell after coming out of the bathroom fracturing his right hip. Patient underwent an ORIF of the right hip generally without complications.-He is on aspirin for anticoagulation Vicodin for pain- . He appears to have a history of bladder cancer followed by urology. He is on Flomax He also has a history of GERD isn o on Prilosec.  Most acute issue recently was a fall apparently in the facility he did go to the ER and was diagnosed with a nasal fracture he also has a contusion-workup in the ER was negative for any acute process other than the nasal fracture-he does not appear to be uncomfortable with this which is encouraging  Patient at times does appear to have some confusion apparently this is later in the day and this appears to be the case this late afternoon-apparently he is somewhat clear in the a.m. hours.  His wife also feels he is dealing with constipation at times would like a routine agent and we will start Colace and that regards.  His wife also is asked about discontinuing Flomax since apparently he urinates quite often-it appears per chart review he's been on Flomax here for significant amount of time and has been followed by urology-at this point would be hesitant to discontinue it but will order a UA CNS secondary to the urinary frequency will defer Flomax discontinuation to urology  . Labs.      BMP Latest Ref Rng 07/03/2015 07/02/2015 07/01/2015  Glucose 65 - 99 mg/dL 91 125(H) 131(H)  BUN 6 - 20 mg/dL 6 9 10   Creatinine 0.61 - 1.24 mg/dL 0.59(L) 0.76 0.75  Sodium 135 - 145 mmol/L 135 136 137  Potassium 3.5 - 5.1  mmol/L 3.5 4.1 4.2  Chloride 101 - 111 mmol/L 104 103 105  CO2 22 - 32 mmol/L 24 23 23   Calcium 8.9 - 10.3 mg/dL 8.1(L) 8.4(L) 8.5(L)    CBC Latest Ref Rng 07/03/2015 07/02/2015 07/01/2015  WBC 4.0 - 10.5 K/uL 6.1 7.6 9.9  Hemoglobin 13.0 - 17.0 g/dL 11.1(L) 11.6(L) 12.4(L)  Hematocrit 39.0 - 52.0 % 33.0(L) 35.6(L) 37.6(L)  Platelets 150 - 400 K/uL 150 158 178    Past Medical History  Diagnosis Date  . Anxiety   . Bladder cancer ([name]) 2008  . Arthritis     Hands and ankles  . History of DVT (deep vein thrombosis) 2008  . BPH (benign prostatic hyperplasia)   . GERD (gastroesophageal reflux disease)   . Sleep apnea     Stop Bang score of 5   Past Surgical History  Procedure Laterality Date  . Prostate surgery  2004  . Bladder surgery  2008  . Inguinal hernia repair Left 09/05/2012    Procedure: HERNIA REPAIR INGUINAL ADULT;  Surgeon: Nicholas So, MD;  Location: [facility];  Service: General;  Laterality: Left;  Left Inguinal Herniorraphy  . Insertion of mesh Left 09/05/2012    Procedure: INSERTION OF MESH;  Surgeon: Nicholas So, MD;  Location: [facility];  Service: General;  Laterality: Left;  . Transurethral resection of prostate N/A 05/16/2013    Procedure: TRANSURETHRAL RESECTION OF THE PROSTATE (TURP);  Surgeon: Nicholas Nestle, MD;  Location: [facility];  Service: Urology;  Laterality: N/A;  . Femur im nail Right 07/01/2015    Procedure: INTRAMEDULLARY (IM) NAIL FEMORAL;  Surgeon: Nicholas Rossetti, MD;  Location: [facility];  Service: Orthopedics;  Laterality: Right;    Current Outpatient Prescriptions on File Prior to Visit  Medication Sig Dispense Refill  . acetaminophen (TYLENOL) 500 MG tablet Take 500 mg by mouth every 6 (six) hours as needed for pain.    Marland Kitchen ALPRAZolam (XANAX) 0.5 MG tablet Take 1 tablet (0.5 mg total) by mouth 3 (three) times daily as needed for anxiety or sleep. 90 tablet 0  . aspirin EC 325 MG EC tablet Take 1 tablet (325 mg total) by mouth daily with  breakfast. 30 tablet 0  . bisacodyl (DULCOLAX) 5 MG EC tablet Take 5 mg by mouth daily as needed for moderate constipation.     . diphenhydrAMINE (BENADRYL) 25 MG tablet Take 25 mg by mouth every 6 (six) hours as needed for allergies.    . fluticasone (FLONASE) 50 MCG/ACT nasal spray Place 2 sprays into both nostrils as needed for allergies or rhinitis.    Marland Kitchen HYDROcodone-acetaminophen (NORCO/VICODIN) 5-325 MG tablet Take 1 tablet by mouth every 4 hours as needed for moderate pain. 180 tablet 0  . tamsulosin (FLOMAX) 0.4 MG CAPS Take 0.4 mg by mouth daily.      Social; patient states  lives in [place] with his wife. He uses a walker. His exact functional status is not clear although he seems to give a history of falling  reports that he quit smoking about 36 years ago. His smoking use included Cigarettes. He quit after 39 years of use. He does not have any smokeless tobacco history on file. He reports that he does not drink alcohol or use illicit drugs.  Family history; none reported by the patient  Review of systems noted since patient is a poor historian obtained from nursing--wifr.  General no complaints of fever or chills HEENT no complaints of oral difficulties or visual loss.   Respiratory no shortness of breath Cardiac no chest pain GI no difficulty swallowing abdominal pain or diarrhea GU;  Urinary frequency as noted above Musculoskeletal; he does not complain of neck or back pain or head ordiscomfort actually ambulating in a wheelchair today Neurologic; no focal weakness  Mental status; complains of memory loss and this appears to be the case this afternoon  Physical examination Temperature 97.8 pulse 59 respirations 18 blood pressure 114/71 weight is 142.7 there appears to be some fluctuation here appears this is relatively baseline with his weight at the end of last month Gen. pleasant man sitting up in his wheelchair [name] somewhat confused but cooperative Skin-she does  have a small laceration above his right orbital area Steri-Strips are in place there is some contusion here as well.   HEENT; arcus senilis oral exam reveals no oral lesions He does have an abrasion on the bridge of his nose there is some dried blood in his turbinates  Respiratory; clear air entry bilaterally Cardiac; heart sounds are normal there are no murmurs euvolemic Abdomen; off nontender with positive bowel sounds GU; no bladder distention or tenderness no CVA tenderness Extremities; mild edema this appears somewhat increased on the right greater than left with a decreased pedal pulse there is no tenderness to palpation edema is cool to touch essentially non-erythematous pedal edema somewhat greater than on the legs--- I would say bordering on 2+ on  the rightfoot Neurologic; cranial nerves seem normal. No pronator drift, no rigidity. Speech is somewhat dysarthric  Mental status He is pleasant oriented only to himself this afternoon apparently this is more often the case later in the day per nursing staff  Impression/plan #1 status post right hip ORIF.  Appears to be doing relatively well with this he is on aspirin for anticoagulation #2 urinary difficulties.   He has a history of BPH and bladder cancer and a lot of voiding complaints. There is not a lot of evidence at the bedside of urinary retention. His wife is concerned with urinary frequency-will update a urinalysis and culture-in regards to the Flomax will continue this for now since he is followed by urology would be hesitant to make any changes without their input  #3 history of nasal fracture-again patient to sustain a fall apparently he is tolerating the fracture  well he does not really report any difficulty  breathing he does not appear to be any distress or significant pain for that matter-at this point will monitor #4 history of gastroesophageal reflux and what sounds like postnasal drip. There is no complaints of  dysphagia--he is on Prilosec  #5 constipation will add Colace 100 mg twice a day and monitor he is on Bisacodyl as well.  #6-history of right foot edema-I suspect this may very well be dependent related but if possible would like to check a venous Doppler rule out any DVT.  We will update labs including a CBC and BMP as well as a BNP with history of edema rule out CHF-also will obtain a B12 level-  [id number]-of note greater than 40 minutes spent assessing patient-discussing his status at bedside with his wife-as well as with nursing-reviewing his chart including ER visit-and coordinating and formulating a plan of care for numerous diagnoses-of note greater than 50% of time spent coordinating plan of care

## 2015-08-07 NOTE — ED Notes (Signed)
Pt made aware to return if symptoms worsen or if any life threatening symptoms occur.   

## 2015-08-08 ENCOUNTER — Encounter (HOSPITAL_COMMUNITY)
Admission: AD | Admit: 2015-08-08 | Discharge: 2015-08-08 | Disposition: A | Discharge status: Home / Self Care | Payer: Medicare Other | Source: Skilled Nursing Facility | Attending: Internal Medicine | Admitting: Internal Medicine

## 2015-08-08 ENCOUNTER — Ambulatory Visit (HOSPITAL_COMMUNITY): Payer: Medicare Other

## 2015-08-08 LAB — BASIC METABOLIC PANEL
ANION GAP: 10 (ref 5–15)
BUN: 18 mg/dL (ref 6–20)
CHLORIDE: 105 mmol/L (ref 101–111)
CO2: 26 mmol/L (ref 22–32)
CREATININE: 0.63 mg/dL (ref 0.61–1.24)
Calcium: 9 mg/dL (ref 8.9–10.3)
GFR calc non Af Amer: >60 mL/min (ref >60)
GLUCOSE: 97 mg/dL (ref 65–99)
Potassium: 4 mmol/L (ref 3.5–5.1)
Sodium: 141 mmol/L (ref 135–145)

## 2015-08-08 LAB — CBC WITH DIFFERENTIAL/PLATELET
Basophils Absolute: 0.1 10*3/uL (ref 0.0–0.1)
Basophils Relative: 1 %
Eosinophils Absolute: 0.2 10*3/uL (ref 0.0–0.7)
Eosinophils Relative: 4 %
HCT: 38.3 % — ABNORMAL LOW (ref 39.0–52.0)
HEMOGLOBIN: 12.6 g/dL — AB (ref 13.0–17.0)
LYMPHS ABS: 1.3 10*3/uL (ref 0.7–4.0)
Lymphocytes Relative: 30 %
MCH: 32.6 pg (ref 26.0–34.0)
MCHC: 32.9 g/dL (ref 30.0–36.0)
MCV: 99 fL (ref 78.0–100.0)
MONO ABS: 0.3 10*3/uL (ref 0.1–1.0)
MONOS PCT: 8 %
NEUTROS ABS: 2.5 10*3/uL (ref 1.7–7.7)
NEUTROS PCT: 57 %
Platelets: 220 10*3/uL (ref 150–400)
RBC: 3.87 MIL/uL — ABNORMAL LOW (ref 4.22–5.81)
RDW: 13.6 % (ref 11.5–15.5)
WBC: 4.4 10*3/uL (ref 4.0–10.5)

## 2015-08-08 LAB — URINALYSIS W MICROSCOPIC (NOT AT ARMC)
Bilirubin Urine: NEGATIVE
GLUCOSE, UA: NEGATIVE mg/dL
Hgb urine dipstick: NEGATIVE
KETONES UR: NEGATIVE mg/dL
Leukocytes, UA: NEGATIVE
Nitrite: NEGATIVE
PH: 7 (ref 5.0–8.0)
Protein, ur: NEGATIVE mg/dL
RBC / HPF: NONE SEEN RBC/hpf (ref 0–5)
Specific Gravity, Urine: 1.015 (ref 1.005–1.030)

## 2015-08-08 LAB — VITAMIN B12: VITAMIN B 12: 265 pg/mL (ref 180–914)

## 2015-08-09 LAB — URINE CULTURE

## 2015-08-16 ENCOUNTER — Other Ambulatory Visit (HOSPITAL_COMMUNITY)
Admission: AD | Admit: 2015-08-16 | Discharge: 2015-08-16 | Disposition: A | Discharge status: Home / Self Care | Payer: Medicare Other | Source: Skilled Nursing Facility | Attending: Internal Medicine | Admitting: Internal Medicine

## 2015-08-16 DIAGNOSIS — R5383 Other fatigue: Secondary | ICD-10-CM | POA: Insufficient documentation

## 2015-08-16 LAB — INFLUENZA PANEL BY PCR (TYPE A & B)
H1N1FLUPCR: NOT DETECTED
INFLAPCR: POSITIVE — AB
Influenza B By PCR: NEGATIVE

## 2015-08-17 ENCOUNTER — Non-Acute Institutional Stay (SKILLED_NURSING_FACILITY): Payer: Medicare Other | Admitting: Internal Medicine

## 2015-08-17 DIAGNOSIS — J09X2 Influenza due to identified novel influenza A virus with other respiratory manifestations: Secondary | ICD-10-CM | POA: Diagnosis not present

## 2015-08-17 NOTE — Progress Notes (Signed)
Patient ID: UNKOWN DEFRONZO, male   DOB: 08/24/1926, 82 y.o.   MRN: [id number]    Facility; [facility] Chief complaint; fever cough History; this is a patient who is here rehabilitating after a hip fracture. He was admitted here in early January. There is an influenza A outbreak in the facility. He developed a low-grade fever and cough for. He have a nasopharyngeal swab which is positive for influenza A. His temperature now is over 101. Patient states he has occasional cough but no sore throat chest pain.  BMP Latest Ref Rng 08/08/2015 07/03/2015 07/02/2015  Glucose 65 - 99 mg/dL 97 91 125(H)  BUN 6 - 20 mg/dL 18 6 9   Creatinine 0.61 - 1.24 mg/dL 0.63 0.59(L) 0.76  Sodium 135 - 145 mmol/L 141 135 136  Potassium 3.5 - 5.1 mmol/L 4.0 3.5 4.1  Chloride 101 - 111 mmol/L 105 104 103  CO2 22 - 32 mmol/L 26 24 23   Calcium 8.9 - 10.3 mg/dL 9.0 8.1(L) 8.4(L)   CBC Latest Ref Rng 08/08/2015 07/03/2015 07/02/2015  WBC 4.0 - 10.5 K/uL 4.4 6.1 7.6  Hemoglobin 13.0 - 17.0 g/dL 12.6(L) 11.1(L) 11.6(L)  Hematocrit 39.0 - 52.0 % 38.3(L) 33.0(L) 35.6(L)  Platelets 150 - 400 K/uL 220 150 158     Past Medical History  Diagnosis Date  . Anxiety   . Bladder cancer ([place]) 2008  . Arthritis     Hands and ankles  . History of DVT (deep vein thrombosis) 2008  . BPH (benign prostatic hyperplasia)   . GERD (gastroesophageal reflux disease)   . Sleep apnea     Stop Bang score of 5    Review of systems Gen. patient states he doesn't feel too bad although he is obviously having chills. Respiratory occasional cough no pleuritic chest pain. No sore throat Cardiac no exertional chest pain GI no nausea vomiting or diarrhea GU no dysuria  Physical examination Gen. the patient doesn't look well shaking chills and fever. O2 sat is 98% on room air respirations 18 Respiratory upper airway sounds in the right lower lobe but no wheezing no crackles no respiratory distress no accessory muscle use Cardiac heart sounds  are normal no murmurs Abdomen no liver no spleen no tenderness no masses GU no suprapubic or costovertebral angle tenderness Extremities no edema.  Impression/plan #1 influenza A. The upper airway sounds in the right lower lobe concern me somewhat although he is oxygenating well not tachypneic. Careful clinical monitoring is in order. Patient was started on Tamiflu at 30 mg a day and she received 2 doses I am not aware of any Tamiflu resistance in the community. He was vaccinated I am not aware of the match of the vaccine to this circulating variant of influenza A. #2 using the Cockroft-Gault equation I calculate his estimated GFR at 77. This would give him Tamiflu prophylactic dose of 75 not 30. I am not sure what equation was used to calculate his estimated GFR and determine the dose of 30 however there shouldn't be that much of a discordance. #3. I'm going to give him full dose 75 mg twice a day #4 I've putting calls to our consultant pharmacist and also infectious disease  question about the match of the vaccine, and tamiflu resistance?

## 2015-08-21 ENCOUNTER — Other Ambulatory Visit (HOSPITAL_COMMUNITY)
Admission: RE | Admit: 2015-08-21 | Discharge: 2015-08-21 | Disposition: A | Discharge status: Home / Self Care | Payer: Medicare Other | Source: Skilled Nursing Facility | Attending: Internal Medicine | Admitting: Internal Medicine

## 2015-08-21 DIAGNOSIS — I10 Essential (primary) hypertension: Secondary | ICD-10-CM | POA: Insufficient documentation

## 2015-08-21 LAB — URINALYSIS W MICROSCOPIC (NOT AT ARMC)
BILIRUBIN URINE: NEGATIVE
GLUCOSE, UA: NEGATIVE mg/dL
KETONES UR: NEGATIVE mg/dL
NITRITE: NEGATIVE
PROTEIN: 30 mg/dL — AB
Specific Gravity, Urine: 1.02 (ref 1.005–1.030)
pH: 6 (ref 5.0–8.0)

## 2015-08-23 LAB — URINE CULTURE

## 2015-08-25 ENCOUNTER — Ambulatory Visit (HOSPITAL_COMMUNITY)
Admission: RE | Admit: 2015-08-25 | Discharge: 2015-08-25 | Disposition: A | Discharge status: Home / Self Care | Payer: Medicare Other | Source: Ambulatory Visit | Attending: Internal Medicine | Admitting: Internal Medicine

## 2015-08-25 ENCOUNTER — Other Ambulatory Visit: Payer: Self-pay | Admitting: Internal Medicine

## 2015-08-25 DIAGNOSIS — R059 Cough, unspecified: Secondary | ICD-10-CM

## 2015-08-25 DIAGNOSIS — R05 Cough: Secondary | ICD-10-CM

## 2015-08-26 ENCOUNTER — Non-Acute Institutional Stay (SKILLED_NURSING_FACILITY): Payer: Medicare Other | Admitting: Internal Medicine

## 2015-08-26 ENCOUNTER — Other Ambulatory Visit (HOSPITAL_COMMUNITY)
Admission: AD | Admit: 2015-08-26 | Discharge: 2015-08-26 | Disposition: A | Discharge status: Home / Self Care | Payer: Medicare Other | Source: Skilled Nursing Facility | Attending: Internal Medicine | Admitting: Internal Medicine

## 2015-08-26 DIAGNOSIS — R531 Weakness: Secondary | ICD-10-CM | POA: Diagnosis not present

## 2015-08-26 DIAGNOSIS — N10 Acute tubulo-interstitial nephritis: Secondary | ICD-10-CM

## 2015-08-26 DIAGNOSIS — R0989 Other specified symptoms and signs involving the circulatory and respiratory systems: Secondary | ICD-10-CM | POA: Diagnosis not present

## 2015-08-26 DIAGNOSIS — N400 Enlarged prostate without lower urinary tract symptoms: Secondary | ICD-10-CM | POA: Insufficient documentation

## 2015-08-26 DIAGNOSIS — R059 Cough, unspecified: Secondary | ICD-10-CM

## 2015-08-26 DIAGNOSIS — R05 Cough: Secondary | ICD-10-CM | POA: Diagnosis not present

## 2015-08-26 LAB — COMPREHENSIVE METABOLIC PANEL
ALT: 13 U/L — AB (ref 17–63)
AST: 19 U/L (ref 15–41)
Albumin: 3.3 g/dL — ABNORMAL LOW (ref 3.5–5.0)
Alkaline Phosphatase: 67 U/L (ref 38–126)
Anion gap: 8 (ref 5–15)
BUN: 18 mg/dL (ref 6–20)
CHLORIDE: 102 mmol/L (ref 101–111)
CO2: 26 mmol/L (ref 22–32)
CREATININE: 0.63 mg/dL (ref 0.61–1.24)
Calcium: 8.6 mg/dL — ABNORMAL LOW (ref 8.9–10.3)
GFR calc Af Amer: >60 mL/min (ref >60)
GFR calc non Af Amer: >60 mL/min (ref >60)
GLUCOSE: 111 mg/dL — AB (ref 65–99)
Potassium: 3.6 mmol/L (ref 3.5–5.1)
SODIUM: 136 mmol/L (ref 135–145)
Total Bilirubin: 1.2 mg/dL (ref 0.3–1.2)
Total Protein: 6.3 g/dL — ABNORMAL LOW (ref 6.5–8.1)

## 2015-08-26 LAB — CBC WITH DIFFERENTIAL/PLATELET
BASOS ABS: 0 10*3/uL (ref 0.0–0.1)
BASOS PCT: 0 %
EOS ABS: 0 10*3/uL (ref 0.0–0.7)
EOS PCT: 0 %
HCT: 40.1 % (ref 39.0–52.0)
HEMOGLOBIN: 13.2 g/dL (ref 13.0–17.0)
LYMPHS ABS: 1.6 10*3/uL (ref 0.7–4.0)
Lymphocytes Relative: 13 %
MCH: 31.4 pg (ref 26.0–34.0)
MCHC: 32.9 g/dL (ref 30.0–36.0)
MCV: 95.5 fL (ref 78.0–100.0)
Monocytes Absolute: 1.5 10*3/uL — ABNORMAL HIGH (ref 0.1–1.0)
Monocytes Relative: 12 %
NEUTROS PCT: 75 %
Neutro Abs: 9.3 10*3/uL — ABNORMAL HIGH (ref 1.7–7.7)
PLATELETS: 317 10*3/uL (ref 150–400)
RBC: 4.2 MIL/uL — AB (ref 4.22–5.81)
RDW: 12.9 % (ref 11.5–15.5)
WBC: 12.5 10*3/uL — AB (ref 4.0–10.5)

## 2015-08-26 NOTE — Progress Notes (Signed)
Patient ID: BRONZE DOLLENS, male   DOB: 1927-01-15, 82 y.o.   MRN: [id number]    This is an acute visit.  Level care skilled.  Facility [facility].  Chief complaint-acute visit follow-up cough congestion-some weakness-family concerns     HiPI this is an 82 year old man who fell after coming out of the bathroom fracturing his right hip. Patient underwent an ORIF of the right hip generally without complications.-He is on aspirin for anticoagulation Vicodin for pain- . He appears to have a history of bladder cancer followed by urology. He is on Flomax--family would like to Flomax change from late in the day to morning secondary to concerns he is urinating more  at night and feel this would help-I did discuss this with them and we will do this to see if this helps although again he is being treated for UTI and I suspect this may be contributing to increased frequency Currently he is on ciprofloxacin for UTI greater than 100,000 colonies of Escherichia coli He also has a history of GERD isn o on Prilosec.  Another  acute issue recently was a fall apparently in the facility he did go to the ER and was diagnosed with a nasal fracture he also has a contusion-workup in the ER was negative for any acute process other than the nasal fracture-this appears to be healing unremarkably  Patient at times does appear to have some confusion apparently this is later in the day He apparently has had some increased confusion recently again he is being treated for UTI.  Another acute issue recently was increased cough and congestion he has completed a course of Tamiflu for a positive influenza a test-a chest x-ray was negative for any acute abnormality-he does continue however with some cough and congestion he is currently afebrile.     . Labs.  08/08/2015.  Sodium 141 potassium 4 BUN 18 creatinine 0.63.  CBC 4.4 hemoglobin 12.6 platelets 220.  B12 level-265    BMP Latest Ref Rng 07/03/2015  07/02/2015 07/01/2015  Glucose 65 - 99 mg/dL 91 125(H) 131(H)  BUN 6 - 20 mg/dL 6 9 10   Creatinine 0.61 - 1.24 mg/dL 0.59(L) 0.76 0.75  Sodium 135 - 145 mmol/L 135 136 137  Potassium 3.5 - 5.1 mmol/L 3.5 4.1 4.2  Chloride 101 - 111 mmol/L 104 103 105  CO2 22 - 32 mmol/L 24 23 23   Calcium 8.9 - 10.3 mg/dL 8.1(L) 8.4(L) 8.5(L)    CBC Latest Ref Rng 07/03/2015 07/02/2015 07/01/2015  WBC 4.0 - 10.5 K/uL 6.1 7.6 9.9  Hemoglobin 13.0 - 17.0 g/dL 11.1(L) 11.6(L) 12.4(L)  Hematocrit 39.0 - 52.0 % 33.0(L) 35.6(L) 37.6(L)  Platelets 150 - 400 K/uL 150 158 178    Past Medical History  Diagnosis Date  . Anxiety   . Bladder cancer ([place]) 2008  . Arthritis     Hands and ankles  . History of DVT (deep vein thrombosis) 2008  . BPH (benign prostatic hyperplasia)   . GERD (gastroesophageal reflux disease)   . Sleep apnea     Stop Bang score of 5   Past Surgical History  Procedure Laterality Date  . Prostate surgery  2004  . Bladder surgery  2008  . Inguinal hernia repair Left 09/05/2012    Procedure: HERNIA REPAIR INGUINAL ADULT;  Surgeon: Jamesetta So, MD;  Location: [facility];  Service: General;  Laterality: Left;  Left Inguinal Herniorraphy  . Insertion of mesh Left 09/05/2012    Procedure: INSERTION OF MESH;  Surgeon: Jamesetta So, MD;  Location: [facility];  Service: General;  Laterality: Left;  . Transurethral resection of prostate N/A 05/16/2013    Procedure: TRANSURETHRAL RESECTION OF THE PROSTATE (TURP);  Surgeon: Marissa Nestle, MD;  Location: [facility];  Service: Urology;  Laterality: N/A;  . Femur im nail Right 07/01/2015    Procedure: INTRAMEDULLARY (IM) NAIL FEMORAL;  Surgeon: Mcarthur Rossetti, MD;  Location: [place];  Service: Orthopedics;  Laterality: Right;    Current Outpatient Prescriptions on File Prior to Visit  Medication Sig Dispense Refill  . acetaminophen (TYLENOL) 500 MG tablet Take 500 mg by mouth every 6 (six) hours as needed for pain.    Marland Kitchen ALPRAZolam (XANAX) 0.5 MG  tablet Take 1 tablet (0.5 mg total) by mouth 3 (three) times daily as needed for anxiety or sleep. 90 tablet 0  . aspirin EC 325 MG EC tablet Take 1 tablet (325 mg total) by mouth daily with breakfast. 30 tablet 0  . bisacodyl (DULCOLAX) 5 MG EC tablet Take 5 mg by mouth daily as needed for moderate constipation.     . diphenhydrAMINE (BENADRYL) 25 MG tablet Take 25 mg by mouth every 6 (six) hours as needed for allergies.    . fluticasone (FLONASE) 50 MCG/ACT nasal spray Place 2 sprays into both nostrils as needed for allergies or rhinitis.    Marland Kitchen HYDROcodone-acetaminophen (NORCO/VICODIN) 5-325 MG tablet Take 1 tablet by mouth every 4 hours as needed for moderate pain. 180 tablet 0  . tamsulosin (FLOMAX) 0.4 MG CAPS Take 0.4 mg by mouth daily.     Social; patient  lives in [place] with his wife. He uses a walker. His exact functional status is not clear although he seems to give a history of falling  reports that he quit smoking about 36 years ago. His smoking use included Cigarettes. He quit after 39 years of use. He does not have any smokeless tobacco history on file. He reports that he does not drink alcohol or use illicit drugs.  Family history; none reported by the patient  Review of systems noted since patient is a poor historian obtained from nursing--  General no complaints of fever or chills--although did have this when he was experiencing the flu recently HEENT no complaints of oral difficulties or visual loss.   Respiratory no shortness of breath but does have some cough and chest congestion Cardiac no chest pain GI no difficulty swallowing abdominal pain or diarrhea GU;  Urinary frequency as noted above--is not specifically complaining of burning Musculoskeletal; he does not complain of neck or back pain or head ordiscomfort  Neurologic; no focal weakness  Mental status; complains of memory loss and this appears to be somewhat progressing-again he is being treated for UTI which  may be contributing to this  Physical examination  Temperature 97.3 pulse 87 respirations 18 blood pressure 118/73 O2 saturation 97% on room air Gen. pleasant man who appears to be weaker than when I last saw him he is in bed resting comfortably Skin Skin is warm and dry previous laceration    head secondary to recent fall appears to be healing unremarkably   HEENT; arcus senilis oral exam reveals no oral lesions He does have an abrasion on the bridge of his nose there is some dried blood in his turbinates  Respiratory; no labored breathing he does have some congested expiratory sounds anterior fields Cardiac; heart sounds are regular irregular there are no murmurs euvolemic Abdomen; soft nontender with  positive bowel sounds GU; no bladder distention or tenderness no CVA tenderness Extremities; Edema appears to be improved from previous exam bilaterally Neurologic; cranial nerves seem normal. No pronator drift, no rigidity. Speech is somewhat dysarthric  Mental status He is oriented to self follows verbal commands without difficulty-is not talking as much as I usually have seen    Impression/plan #1 status post right hip ORIF.  Appears to be doing relatively well with this he is on aspirin for anticoagulation #2 urinary difficulties.   He has a history of BPH and bladder cancer and a lot of voiding complaints.  Urine culture is growing out Escherichia coli he is on ciprofloxacin-will change antibiotics secondary to respiratory issues as noted below--family would like Flomax changed to earlier in the day will do that-previously family has asked about discontinuing Flomax but have a advise them that would be hesitant to do this without urology input and family has expressed understanding  #3 history of nasal fracture- This appears to be resolving fairly unremarkably   #4-continue chest congestion-chest x-ray negative for any acute process he is completed a course of Tamiflu for  influenza A-does appear somewhat weaker than I'm used to seeing-will order Mucinex 600 mg twice a day for 7 days as well as Ventolin nebulizers every 6 hours routine for 72 hours and when necessary.  There is a suspicion he may have a mild URI here this was discussed with Dr. Dellia Nims via phone-will switch antibiotic from Cipro which he is receiving for UTI and start Levaquin--this should have coverage for both the urinary tract infection as well as some respiratory coverage  Also for concerns of frailty and weakness they would like to get stat blood work including a CBC with differential and CMP. As well as ammonia level.  Update-we have received the results of the CBC and metabolic panel does show a mildly elevated white count of 12.5-hemoglobin 13.2 platelets 317.  Neutrophils absolute elevated at 9.3.  Sodium 136 potassium 3.6 BUN 18 creatinine 0.63.  Ammonia level is pending  At this point will continue current treatment plan white count is mildly elevated he is on antibiotic suspect this may be related to the UTI- will update the CBC later this week     [id number]-of note greater than 35 minutes spent assessing patient-discussing his status at bedside with his wife  and son-as well as with nursing-and coordinating and formulating a plan of care for numerous diagnoses-of note greater than 50% of time spent coordinating plan of care

## 2015-08-27 ENCOUNTER — Non-Acute Institutional Stay (SKILLED_NURSING_FACILITY): Payer: Medicare Other | Admitting: Internal Medicine

## 2015-08-27 DIAGNOSIS — R0989 Other specified symptoms and signs involving the circulatory and respiratory systems: Secondary | ICD-10-CM | POA: Diagnosis not present

## 2015-08-27 DIAGNOSIS — R4182 Altered mental status, unspecified: Secondary | ICD-10-CM

## 2015-08-27 DIAGNOSIS — N10 Acute tubulo-interstitial nephritis: Secondary | ICD-10-CM

## 2015-08-27 NOTE — Progress Notes (Signed)
Patient ID: [name], male   DOB: 1927/05/06, 82 y.o.   MRN: [id number]     This is an acute visit.  Level care skilled.  Facility [facility].  Chief complaint-acute visit follow-up respiratory issues-UTI-confusion     HiPI this is an 82 year old man who fell after coming out of the bathroom fracturing his right hip. Patient underwent an ORIF of the right hip generally without complications.-He is on aspirin for anticoagulation Vicodin for pain- .      he has completed a course of Tamiflu for a positive influenza a test-a chest x-ray was negative for any acute abnormality-he does continue however with some cough and congestion he is currently afebrile.--Yesterday we did start Mucinex as well as nebulizers this appears to be helping some per exam today still has some chest congestion but appears to be clear there is no labored breathing chest x-ray was negative for any acute process.  He's also been switched to Levaquin from ciprofloxacin he was on Cipro for an Escherichia coli UTI with switched to Levaquin yesterday for concerns of respiratory coverage.  He did have blood work yesterday which showed a mildly elevated white count of 12.5-however today he is afebrile appears to be doing better.  He also had confusion yesterday this appears to be improved as well     . Labs.  08/26/2015.  WBC 12.5 hemoglobin 13.2 platelets 317.  Sodium 136 potassium 3.6 BUN 18 creatinine 0.63.  CO2 level XXVI.  ALT 13 otherwise liver function tests within normal limits  08/08/2015.  Sodium 141 potassium 4 BUN 18 creatinine 0.63.  CBC 4.4 hemoglobin 12.6 platelets 220.  B12 level-265    BMP Latest Ref Rng 07/03/2015 07/02/2015 07/01/2015  Glucose 65 - 99 mg/dL 91 125(H) 131(H)  BUN 6 - 20 mg/dL 6 9 10   Creatinine 0.61 - 1.24 mg/dL 0.59(L) 0.76 0.75  Sodium 135 - 145 mmol/L 135 136 137  Potassium 3.5 - 5.1 mmol/L 3.5 4.1 4.2  Chloride 101 - 111 mmol/L 104 103 105  CO2 22  - 32 mmol/L 24 23 23   Calcium 8.9 - 10.3 mg/dL 8.1(L) 8.4(L) 8.5(L)    CBC Latest Ref Rng 07/03/2015 07/02/2015 07/01/2015  WBC 4.0 - 10.5 K/uL 6.1 7.6 9.9  Hemoglobin 13.0 - 17.0 g/dL 11.1(L) 11.6(L) 12.4(L)  Hematocrit 39.0 - 52.0 % 33.0(L) 35.6(L) 37.6(L)  Platelets 150 - 400 K/uL 150 158 178    Past Medical History  Diagnosis Date  . Anxiety   . Bladder cancer ([place]) 2008  . Arthritis     Hands and ankles  . History of DVT (deep vein thrombosis) 2008  . BPH (benign prostatic hyperplasia)   . GERD (gastroesophageal reflux disease)   . Sleep apnea     Stop Bang score of 5   Past Surgical History  Procedure Laterality Date  . Prostate surgery  2004  . Bladder surgery  2008  . Inguinal hernia repair Left 09/05/2012    Procedure: HERNIA REPAIR INGUINAL ADULT;  Surgeon: Jamesetta So, MD;  Location: [facility];  Service: General;  Laterality: Left;  Left Inguinal Herniorraphy  . Insertion of mesh Left 09/05/2012    Procedure: INSERTION OF MESH;  Surgeon: Jamesetta So, MD;  Location: [facility];  Service: General;  Laterality: Left;  . Transurethral resection of prostate N/A 05/16/2013    Procedure: TRANSURETHRAL RESECTION OF THE PROSTATE (TURP);  Surgeon: Marissa Nestle, MD;  Location: [facility];  Service: Urology;  Laterality: N/A;  .  Femur im nail Right 07/01/2015    Procedure: INTRAMEDULLARY (IM) NAIL FEMORAL;  Surgeon: Mcarthur Rossetti, MD;  Location: [facility];  Service: Orthopedics;  Laterality: Right;    Current Outpatient Prescriptions on File Prior to Visit  Medication Sig Dispense Refill  . acetaminophen (TYLENOL) 500 MG tablet Take 500 mg by mouth every 6 (six) hours as needed for pain.    Marland Kitchen ALPRAZolam (XANAX) 0.5 MG tablet Take 1 tablet (0.5 mg total) by mouth 3 (three) times daily as needed for anxiety or sleep. 90 tablet 0  . aspirin EC 325 MG EC tablet Take 1 tablet (325 mg total) by mouth daily with breakfast. 30 tablet 0  . bisacodyl (DULCOLAX) 5 MG EC tablet Take 5 mg by  mouth daily as needed for moderate constipation.     . diphenhydrAMINE (BENADRYL) 25 MG tablet Take 25 mg by mouth every 6 (six) hours as needed for allergies.    . fluticasone (FLONASE) 50 MCG/ACT nasal spray Place 2 sprays into both nostrils as needed for allergies or rhinitis.    Marland Kitchen HYDROcodone-acetaminophen (NORCO/VICODIN) 5-325 MG tablet Take 1 tablet by mouth every 4 hours as needed for moderate pain. 180 tablet 0  . tamsulosin (FLOMAX) 0.4 MG CAPS Take 0.4 mg by mouth daily.     Social; patient  lives in [place] with his wife. He uses a walker. His exact functional status is not clear although he seems to give a history of falling  reports that he quit smoking about 36 years ago. His smoking use included Cigarettes. He quit after 39 years of use. He does not have any smokeless tobacco history on file. He reports that he does not drink alcohol or use illicit drugs.  Family history; none reported by the patient  Review of systems noted since patient is a poor historian obtained from nursing--patient  General no complaints of fever or chills--although did have this when he was experiencing the flu recently HEENT no complaints of oral difficulties or visual loss.   Respiratory no shortness of breath but does have some cough and chest congestion although this appears to be gradually improving today Cardiac no chest pain GI no difficulty swallowing abdominal pain or diarrhea GU;  Urinary frequency as noted above--is not specifically complaining of burning Musculoskeletal; he does not complain of neck or back pain or head ordiscomfort  Neurologic; no focal weakness  Mental status; complains of memory loss and this appears to be somewhat progressing-again he is being treated for UTI which may be contributing to this  Physical examination  Temperature 98.7 pulse 70 respirations 18 blood pressure 109/54 Gen. pleasant man who appears more comfortable than he did yesterday appears less  confused he is in bed resting comfortably Skin Skin is warm and dry previous laceration    head secondary to recent fall appears to be healing unremarkably   HEENT; arcus senilis oral exam reveals no oral lesions   Respiratory; no labored breathing he does have some congested expiratory sounds anterior fields but these clear fairly well with cough Cardiac; heart sounds are regular irregular there are no murmurs euvolemic Abdomen; soft nontender with positive bowel sounds GU; no bladder distention or tenderness no CVA tenderness Extremities; Edema appears to be baseloine Neurologic; cranial nerves seem normal. No pronator drift, no rigidity. Speech is somewhat dysarthric  Mental status He is oriented to self follows verbal commands without difficulty-appears more comfortable less anxious than yesterday-more interactive and less confused   Impression/plan  1  history of UTI-complicated with chest congestion respiratory issues-this appears to be improving antibiotic was switched from Cipro to Levaquin yesterday-he does have a mildly elevated white count will recheck this later this week he is afebrile appears to be doing better today there is less confusion respiratory status appears to be improved somewhat he does not complain of dysuria currently.  At this point will monitor.  We did have a TSH and ammonia level pending.  [id number]  #

## 2015-08-28 ENCOUNTER — Encounter (HOSPITAL_COMMUNITY)
Admission: AD | Admit: 2015-08-28 | Discharge: 2015-08-28 | Disposition: A | Discharge status: Home / Self Care | Payer: Medicare Other | Source: Skilled Nursing Facility | Attending: Internal Medicine | Admitting: Internal Medicine

## 2015-08-28 LAB — CBC WITH DIFFERENTIAL/PLATELET
BASOS ABS: 0 10*3/uL (ref 0.0–0.1)
BASOS PCT: 0 %
EOS ABS: 0.1 10*3/uL (ref 0.0–0.7)
Eosinophils Relative: 1 %
HEMATOCRIT: 40.4 % (ref 39.0–52.0)
HEMOGLOBIN: 13.1 g/dL (ref 13.0–17.0)
Lymphocytes Relative: 12 %
Lymphs Abs: 1.1 10*3/uL (ref 0.7–4.0)
MCH: 31 pg (ref 26.0–34.0)
MCHC: 32.4 g/dL (ref 30.0–36.0)
MCV: 95.7 fL (ref 78.0–100.0)
Monocytes Absolute: 0.9 10*3/uL (ref 0.1–1.0)
Monocytes Relative: 9 %
NEUTROS ABS: 7.7 10*3/uL (ref 1.7–7.7)
NEUTROS PCT: 78 %
Platelets: 369 10*3/uL (ref 150–400)
RBC: 4.22 MIL/uL (ref 4.22–5.81)
RDW: 12.9 % (ref 11.5–15.5)
WBC: 9.8 10*3/uL (ref 4.0–10.5)

## 2015-08-28 LAB — TSH: TSH: 1.173 u[IU]/mL (ref 0.350–4.500)

## 2015-08-28 LAB — AMMONIA

## 2015-08-31 ENCOUNTER — Encounter: Payer: Self-pay | Admitting: Internal Medicine

## 2015-08-31 DIAGNOSIS — R0989 Other specified symptoms and signs involving the circulatory and respiratory systems: Secondary | ICD-10-CM | POA: Insufficient documentation

## 2015-08-31 DIAGNOSIS — N390 Urinary tract infection, site not specified: Secondary | ICD-10-CM | POA: Insufficient documentation

## 2015-09-01 ENCOUNTER — Encounter: Payer: Self-pay | Admitting: Internal Medicine

## 2015-09-01 DIAGNOSIS — R4182 Altered mental status, unspecified: Secondary | ICD-10-CM | POA: Insufficient documentation

## 2015-09-04 ENCOUNTER — Other Ambulatory Visit: Payer: Self-pay

## 2015-09-04 MED ORDER — HYDROCODONE-ACETAMINOPHEN 5-325 MG PO TABS: ORAL_TABLET | ORAL | Status: DC

## 2015-09-04 NOTE — Telephone Encounter (Signed)
RX faxed to [facility] @ [phone]. Phone number [phone]  

## 2015-09-24 ENCOUNTER — Ambulatory Visit (HOSPITAL_COMMUNITY)
Admit: 2015-09-24 | Discharge: 2015-09-24 | Disposition: A | Discharge status: Home / Self Care | Payer: Medicare Other | Source: Skilled Nursing Facility | Attending: Internal Medicine | Admitting: Internal Medicine

## 2015-09-24 ENCOUNTER — Non-Acute Institutional Stay (SKILLED_NURSING_FACILITY): Payer: Medicare Other | Admitting: Internal Medicine

## 2015-09-24 DIAGNOSIS — R0989 Other specified symptoms and signs involving the circulatory and respiratory systems: Secondary | ICD-10-CM | POA: Diagnosis not present

## 2015-09-24 DIAGNOSIS — R059 Cough, unspecified: Secondary | ICD-10-CM | POA: Insufficient documentation

## 2015-09-24 DIAGNOSIS — R05 Cough: Secondary | ICD-10-CM

## 2015-09-24 DIAGNOSIS — N400 Enlarged prostate without lower urinary tract symptoms: Secondary | ICD-10-CM

## 2015-09-24 DIAGNOSIS — R918 Other nonspecific abnormal finding of lung field: Secondary | ICD-10-CM | POA: Diagnosis not present

## 2015-09-24 DIAGNOSIS — S72001D Fracture of unspecified part of neck of right femur, subsequent encounter for closed fracture with routine healing: Secondary | ICD-10-CM | POA: Diagnosis not present

## 2015-09-24 NOTE — Progress Notes (Signed)
Patient ID: [name], male   DOB: 07-14-1926, 82 y.o.   MRN: [id number]     This is an acute  routine visit.  Level care skilled.  Facility [facility].  Chief complaint-acute visit follow-up cough congestion medical management of chronic issues including BPH--GERD-history right hip fracture status post repair      HiP- this is an 82 year old man who fell after coming out of the bathroom fracturing his right hip. Patient underwent an ORIF of the right hip generally without complications.-He is on aspirin for anticoagulation Vicodin for pain- . He appears to have a history of bladder cancer followed by urology. He is on Flomax- This was switched a every morning dosing per family request secondary to apparently complaints of encouraged urinary frequency-apparently this has gotten better-he was treated for a UTI as well which I suspect contributed to the frequency.  He was seen about a month ago for some increased cough and congestion he actually was treated for positive  flu culture with Tamiflu as well.  Chest x-ray was negative for acute process he has completed a course of Levaquin and apparently this resolved fairly unremarkably.  However according nursing he's developed again some coughing here and would like him reevaluated-he is a poor historian-but is not complaining of any increased shortness of breath   He also has a history of GERD is on Prilosec.  Another  acute issue recently was a fall apparently in the facility he did go to the ER and was diagnosed with a nasal fracture he also has a contusion-workup in the ER was negative for any acute process other than the nasal fracture-this appears to be healing unremarkably        . Labs.  08/28/2015.  WBC 9.8 hemoglobin 13.1 platelets 369.  TSH-1.173.  Ammonia less than 9.  08/26/2015.  Sodium 136 potassium 3.6 BUN 18 creatinine 0.63.  Albumin 3.3 ALT 13 otherwise liver function tests within normal  limits    08/08/2015.  Sodium 141 potassium 4 BUN 18 creatinine 0.63.  CBC 4.4 hemoglobin 12.6 platelets 220.  B12 level-265    BMP Latest Ref Rng 07/03/2015 07/02/2015 07/01/2015  Glucose 65 - 99 mg/dL 91 125(H) 131(H)  BUN 6 - 20 mg/dL 6 9 10   Creatinine 0.61 - 1.24 mg/dL 0.59(L) 0.76 0.75  Sodium 135 - 145 mmol/L 135 136 137  Potassium 3.5 - 5.1 mmol/L 3.5 4.1 4.2  Chloride 101 - 111 mmol/L 104 103 105  CO2 22 - 32 mmol/L 24 23 23   Calcium 8.9 - 10.3 mg/dL 8.1(L) 8.4(L) 8.5(L)    CBC Latest Ref Rng 07/03/2015 07/02/2015 07/01/2015  WBC 4.0 - 10.5 K/uL 6.1 7.6 9.9  Hemoglobin 13.0 - 17.0 g/dL 11.1(L) 11.6(L) 12.4(L)  Hematocrit 39.0 - 52.0 % 33.0(L) 35.6(L) 37.6(L)  Platelets 150 - 400 K/uL 150 158 178    Past Medical History  Diagnosis Date  . Anxiety   . Bladder cancer ([place]) 2008  . Arthritis     Hands and ankles  . History of DVT (deep vein thrombosis) 2008  . BPH (benign prostatic hyperplasia)   . GERD (gastroesophageal reflux disease)   . Sleep apnea     Stop Bang score of 5   Past Surgical History  Procedure Laterality Date  . Prostate surgery  2004  . Bladder surgery  2008  . Inguinal hernia repair Left 09/05/2012    Procedure: HERNIA REPAIR INGUINAL ADULT;  Surgeon: Jamesetta So, MD;  Location: [facility];  Service: General;  Laterality: Left;  Left Inguinal Herniorraphy  . Insertion of mesh Left 09/05/2012    Procedure: INSERTION OF MESH;  Surgeon: Jamesetta So, MD;  Location: [facility];  Service: General;  Laterality: Left;  . Transurethral resection of prostate N/A 05/16/2013    Procedure: TRANSURETHRAL RESECTION OF THE PROSTATE (TURP);  Surgeon: Marissa Nestle, MD;  Location: [facility];  Service: Urology;  Laterality: N/A;  . Femur im nail Right 07/01/2015    Procedure: INTRAMEDULLARY (IM) NAIL FEMORAL;  Surgeon: Mcarthur Rossetti, MD;  Location: [facility];  Service: Orthopedics;  Laterality: Right;    Current Outpatient Prescriptions on File Prior to Visit   Medication Sig Dispense Refill  . acetaminophen (TYLENOL) 500 MG tablet Take 500 mg by mouth every 6 (six) hours as needed for pain.    Marland Kitchen ALPRAZolam (XANAX) 0.5 MG tablet Take 1 tablet (0.5 mg total) by mouth 3 (three) times daily as needed for anxiety or sleep. 90 tablet 0  . aspirin EC 325 MG EC tablet Take 1 tablet (325 mg total) by mouth daily with breakfast. 30 tablet 0  . bisacodyl (DULCOLAX) 5 MG EC tablet Take 5 mg by mouth daily as needed for moderate constipation.     . diphenhydrAMINE (BENADRYL) 25 MG tablet Take 25 mg by mouth every 6 (six) hours as needed for allergies.    . fluticasone (FLONASE) 50 MCG/ACT nasal spray Place 2 sprays into both nostrils as needed for allergies or rhinitis.    Marland Kitchen HYDROcodone-acetaminophen (NORCO/VICODIN) 5-325 MG tablet Take 1 tablet by mouth every 4 hours as needed for moderate pain. 180 tablet 0  . tamsulosin (FLOMAX) 0.4 MG CAPS Take 0.4 mg by mouth daily.    Albuterol nebulizers every 6 hours when necessary.--We'll switch this to Atrovent   Social; patient  lives in [place] with his wife. He uses a walker. His exact functional status is not clear although he seems to give a history of falling  reports that he quit smoking about 36 years ago. His smoking use included Cigarettes. He quit after 39 years of use. He does not have any smokeless tobacco history on file. He reports that he does not drink alcohol or use illicit drugs.  Family history; none reported by the patient  Review of systems noted since patient is a poor historian obtained from nursing--  General no complaints of fever or chills-- No rashes or itching noted HEENT no complaints of oral difficulties or visual loss.--Apparently has had some nasal drainage at times   Respiratory no shortness of breath but does have some cough and chest congestion Cardiac no chest pain GI no difficulty swallowing abdominal pain or diarrhea GU;  Urinary frequency as noted above--apparently this  has improved status post treatment for UTI Musculoskeletal; he does not complain of neck or back pain or head ordiscomfort  Neurologic; no focal weakness complaints of dizziness or headache  Mental status; complains of memory loss and this appears to be progressing somewhat  Physical examination  Temperature 98.9 pulse 94 respirations 24 blood pressure 122/57 O2 saturation 92% on room air Gen. pleasant man who appears frail but in no distress Skin Skin is warm and dry --a laceration sustained in fall several weeks: Appears to be healed unremarkably    HEENT; arcus senilis oral exam reveals no oral lesions Oropharynx clear mucous membranes moist I do not see active drainage from the nose but apparently there's been some   Respiratory; no labored breathing he  does have some congestion bronchial sounds lower base on the left Cardiac; heart sounds are somewhat distant largely regular with some irregular beats pulse in the 90's Abdomen; soft nontender with positive bowel sounds GU; no bladder distention or tenderness no CVA tenderness Extremities; Could not really appreciate any significant lower extremity edema Moves his extremities at baseline with lower survey weakness I do not seen deformities other than arthritic Neurologic; cranial nerves seem normal. No pronator drift, no rigidity. Speech is somewhat dysarthric  Mental status He is oriented to self follows verbal commands without difficulty-appears essentially oriented to self only   Impression/plan  1 cough with some congestion-will order a chest x-ray 2 view-also Mucinex 600 mg twice a day for 7 days-we'll continue the Flonase will make this routine for 7 days as well.  Monitor vital signs pulse ox every shift for 72 hours to keep an eye on this  Also will make nebulizers routine for 72 hours every 6 hours and when necessary--secondary to occasionally borderline tachycardia will switch nebulizers to Atrovent  #2 status post  right hip ORIF.  Appears to be doing relatively well with this he is on aspirin for anticoagulation--  #3--history of BPH-continues on Flomax-apparently urinary frequency has improved somewhat--he is followed by urology with a history of bladder cancer--I do not see follow-up scheduled per review of Epic  will write an order to see what the status of this is.  #4 history GERD this appears to be stable on PPI.  #5 history of anxiety apparently Xanax dose was reduced apparently this has been effective-- he gets this 3 times daily as needed   Of note Will update a CBC and BMP as well to make sure electrolytes and white count are within normal limits.  [id number] note greater than 35 minutes spent assessing patient reviewing his chart-discussing his status with nursing staff-and coordinating and formulating a plan of care for numerous diagnoses-of note greater than 50% of time spent coordinating plan of care

## 2015-09-25 ENCOUNTER — Non-Acute Institutional Stay (SKILLED_NURSING_FACILITY): Payer: Medicare Other | Admitting: Internal Medicine

## 2015-09-25 ENCOUNTER — Encounter (HOSPITAL_COMMUNITY)
Admission: RE | Admit: 2015-09-25 | Discharge: 2015-09-25 | Disposition: A | Discharge status: Home / Self Care | Payer: Medicare Other | Source: Skilled Nursing Facility | Attending: Internal Medicine | Admitting: Internal Medicine

## 2015-09-25 DIAGNOSIS — Z9181 History of falling: Secondary | ICD-10-CM | POA: Diagnosis present

## 2015-09-25 DIAGNOSIS — J189 Pneumonia, unspecified organism: Secondary | ICD-10-CM

## 2015-09-25 DIAGNOSIS — R488 Other symbolic dysfunctions: Secondary | ICD-10-CM | POA: Insufficient documentation

## 2015-09-25 DIAGNOSIS — S72141D Displaced intertrochanteric fracture of right femur, subsequent encounter for closed fracture with routine healing: Secondary | ICD-10-CM | POA: Insufficient documentation

## 2015-09-25 DIAGNOSIS — R3989 Other symptoms and signs involving the genitourinary system: Secondary | ICD-10-CM | POA: Insufficient documentation

## 2015-09-25 DIAGNOSIS — R5383 Other fatigue: Secondary | ICD-10-CM | POA: Insufficient documentation

## 2015-09-25 DIAGNOSIS — R350 Frequency of micturition: Secondary | ICD-10-CM | POA: Diagnosis present

## 2015-09-25 DIAGNOSIS — X58XXXD Exposure to other specified factors, subsequent encounter: Secondary | ICD-10-CM | POA: Diagnosis not present

## 2015-09-25 LAB — CBC WITH DIFFERENTIAL/PLATELET
Basophils Absolute: 0 10*3/uL (ref 0.0–0.1)
Basophils Relative: 1 %
EOS ABS: 0.2 10*3/uL (ref 0.0–0.7)
EOS PCT: 2 %
HCT: 39.1 % (ref 39.0–52.0)
Hemoglobin: 12.9 g/dL — ABNORMAL LOW (ref 13.0–17.0)
LYMPHS ABS: 2 10*3/uL (ref 0.7–4.0)
LYMPHS PCT: 28 %
MCH: 31.3 pg (ref 26.0–34.0)
MCHC: 33 g/dL (ref 30.0–36.0)
MCV: 94.9 fL (ref 78.0–100.0)
MONO ABS: 0.7 10*3/uL (ref 0.1–1.0)
Monocytes Relative: 10 %
Neutro Abs: 4.4 10*3/uL (ref 1.7–7.7)
Neutrophils Relative %: 59 %
PLATELETS: 194 10*3/uL (ref 150–400)
RBC: 4.12 MIL/uL — AB (ref 4.22–5.81)
RDW: 13.9 % (ref 11.5–15.5)
WBC: 7.4 10*3/uL (ref 4.0–10.5)

## 2015-09-25 LAB — COMPREHENSIVE METABOLIC PANEL
ALT: 8 U/L — ABNORMAL LOW (ref 17–63)
ANION GAP: 9 (ref 5–15)
AST: 17 U/L (ref 15–41)
Albumin: 3.2 g/dL — ABNORMAL LOW (ref 3.5–5.0)
Alkaline Phosphatase: 56 U/L (ref 38–126)
BUN: 25 mg/dL — ABNORMAL HIGH (ref 6–20)
CHLORIDE: 103 mmol/L (ref 101–111)
CO2: 28 mmol/L (ref 22–32)
CREATININE: 0.77 mg/dL (ref 0.61–1.24)
Calcium: 8.5 mg/dL — ABNORMAL LOW (ref 8.9–10.3)
Glucose, Bld: 99 mg/dL (ref 65–99)
POTASSIUM: 3.7 mmol/L (ref 3.5–5.1)
SODIUM: 140 mmol/L (ref 135–145)
Total Bilirubin: 1.1 mg/dL (ref 0.3–1.2)
Total Protein: 6 g/dL — ABNORMAL LOW (ref 6.5–8.1)

## 2015-09-25 NOTE — Progress Notes (Signed)
Patient ID: [name], male   DOB: 07/02/1926, 82 y.o.   MRN: [id number]      This is an acute   visit.  Level care skilled.  Facility [facility].  Chief complaint-acute visit follow-up cough congestion  With x-ray showing mild left lower lobe pneumonia      HiP- this is an 82 year old man who was seen yesterday for some coughing congestion-we did order a chest x-ray as well as start him on Mucinex and routine Atrovent nebulizers-clinically he appears to be stable the x-ray actually has shown suspected mild left lower lobe pneumonia-on call provider was contacted about this last night and did start him on Avelox for a 5 day course as well as a probiotic.  Today he appears to be stable he still has a cough according his wife this is productive-also has some chest congestion although there is no labored breathing or sign of distress here.  His vital signs including O2 sats appear to be satisfactory with O2 saturation in the 90s on room air.  This is complicated somewhat with a history of influenza several weeks ago he was treated with Tamiflu-also was treated with Levaquin for suspected URI-x-ray at that point did not really show any definitive pneumonia   We also ordered lab work which is come back fairly unremarkable with a white count of 7.4 hemoglobin stable at 12.9.  Electrolytes appear to be within normal range renal function appears stable with a creatinine of 0.77 .           . Labs.  09/25/2015.  WBC 7.4 hemoglobin 12.9 platelets 194.  Sodium 140 potassium 3.7 BUN 25 creatinine 0.77.  Albumin 3.2-ALT 8-otherwise liver function tests within normal limits    08/28/2015.  WBC 9.8 hemoglobin 13.1 platelets 369.  TSH-1.173.  Ammonia less than 9.  08/26/2015.  Sodium 136 potassium 3.6 BUN 18 creatinine 0.63.  Albumin 3.3 ALT 13 otherwise liver function tests within normal limits    08/08/2015.  Sodium 141 potassium 4 BUN 18 creatinine  0.63.  CBC 4.4 hemoglobin 12.6 platelets 220.  B12 level-265    BMP Latest Ref Rng 07/03/2015 07/02/2015 07/01/2015  Glucose 65 - 99 mg/dL 91 125(H) 131(H)  BUN 6 - 20 mg/dL 6 9 10   Creatinine 0.61 - 1.24 mg/dL 0.59(L) 0.76 0.75  Sodium 135 - 145 mmol/L 135 136 137  Potassium 3.5 - 5.1 mmol/L 3.5 4.1 4.2  Chloride 101 - 111 mmol/L 104 103 105  CO2 22 - 32 mmol/L 24 23 23   Calcium 8.9 - 10.3 mg/dL 8.1(L) 8.4(L) 8.5(L)    CBC Latest Ref Rng 07/03/2015 07/02/2015 07/01/2015  WBC 4.0 - 10.5 K/uL 6.1 7.6 9.9  Hemoglobin 13.0 - 17.0 g/dL 11.1(L) 11.6(L) 12.4(L)  Hematocrit 39.0 - 52.0 % 33.0(L) 35.6(L) 37.6(L)  Platelets 150 - 400 K/uL 150 158 178    Past Medical History  Diagnosis Date  . Anxiety   . Bladder cancer ([place]) 2008  . Arthritis     Hands and ankles  . History of DVT (deep vein thrombosis) 2008  . BPH (benign prostatic hyperplasia)   . GERD (gastroesophageal reflux disease)   . Sleep apnea     Stop Bang score of 5   Past Surgical History  Procedure Laterality Date  . Prostate surgery  2004  . Bladder surgery  2008  . Inguinal hernia repair Left 09/05/2012    Procedure: HERNIA REPAIR INGUINAL ADULT;  Surgeon: Jamesetta So, MD;  Location: [facility];  Service: General;  Laterality: Left;  Left Inguinal Herniorraphy  . Insertion of mesh Left 09/05/2012    Procedure: INSERTION OF MESH;  Surgeon: Jamesetta So, MD;  Location: [facility];  Service: General;  Laterality: Left;  . Transurethral resection of prostate N/A 05/16/2013    Procedure: TRANSURETHRAL RESECTION OF THE PROSTATE (TURP);  Surgeon: Marissa Nestle, MD;  Location: [facility];  Service: Urology;  Laterality: N/A;  . Femur im nail Right 07/01/2015    Procedure: INTRAMEDULLARY (IM) NAIL FEMORAL;  Surgeon: Mcarthur Rossetti, MD;  Location: [facility];  Service: Orthopedics;  Laterality: Right;    Current Outpatient Prescriptions on File Prior to Visit  Medication Sig Dispense Refill  . acetaminophen (TYLENOL) 500 MG  tablet Take 500 mg by mouth every 6 (six) hours as needed for pain.    Marland Kitchen ALPRAZolam (XANAX) 0.5 MG tablet Take 1 tablet (0.5 mg total) by mouth 3 (three) times daily as needed for anxiety or sleep. 90 tablet 0  . aspirin EC 325 MG EC tablet Take 1 tablet (325 mg total) by mouth daily with breakfast. 30 tablet 0  . bisacodyl (DULCOLAX) 5 MG EC tablet Take 5 mg by mouth daily as needed for moderate constipation.     . diphenhydrAMINE (BENADRYL) 25 MG tablet Take 25 mg by mouth every 6 (six) hours as needed for allergies.    . fluticasone (FLONASE) 50 MCG/ACT nasal spray Place 2 sprays into both nostrils as needed for allergies or rhinitis.    Marland Kitchen HYDROcodone-acetaminophen (NORCO/VICODIN) 5-325 MG tablet Take 1 tablet by mouth every 4 hours as needed for moderate pain. 180 tablet 0  . tamsulosin (FLOMAX) 0.4 MG CAPS Take 0.4 mg by mouth daily.    Albuterol nebulizers every 6 hours when necessary.--Have switch this to Atrovent.  Also is on Mucinex for short course 600 mg twice a day   Social; patient  lives in [place] with his wife. He uses a walker. His exact functional status is not clear although he seems to give a history of falling  reports that he quit smoking about 36 years ago. His smoking use included Cigarettes. He quit after 39 years of use. He does not have any smokeless tobacco history on file. He reports that he does not drink alcohol or use illicit drugs.  Family history; none reported by the patient  Review of systems noted since patient is a poor historian obtained from nursing--  General no complaints of fever or chills-- No rashes or itching noted HEENT no complaints of oral difficulties or visual loss.--Apparently has had some nasal drainage at times   Respiratory no shortness of breath but does have some cough and chest congestion Cardiac no chest pain GI no difficulty swallowing abdominal pain or diarrhea GU;  Urinary frequency as noted above--apparently this has  improved status post treatment for UTI Musculoskeletal; he does not complain of neck or back pain or head ordiscomfort  Neurologic; no focal weakness complaints of dizziness or headache  Mental status; complains of memory loss and this appears to be progressing somewhat  Physical examination  Vital signs remained stable with O2 saturation in the 90s on room air-pulse is 70-respirations 20 blood pressure 117/65 Gen. pleasant man who appears frail but in no distress Skin Skin is warm and dry --a\    HEENT; arcus senilis oral exam reveals no oral lesions Oropharynx clear mucous membranes moist I do not see active drainage from the nose    Respiratory; no labored  breathing does have somewhat diffuse coarse breath sounds on expiration this clears somewhat with cough Cardiac; heart sounds are somewhat distant largely regular with some irregular beats pulse in the 80's Abdomen; soft nontender with positive bowel sounds  Extremities; Could not really appreciate any significant lower extremity edema Moves his extremities at baseline with lower survey weakness I do not seen deformities other than arthritic Neurologic; cranial nerves seem normal. No pronator drift, no rigidity. Speech is somewhat dysarthric  Mental status He is oriented to self follows verbal commands without difficulty-appears essentially oriented to self only   Impression/plan  1  History of left lower lobe pneumonia-on call provider has started him on Avelox for a 5 day course as well as probiotic-he appears to be stable today lab work was fairly unremarkable white count was not elevated we'll continue him on Avelox and monitor clinically-routine nebulizers have been ordered these will have to be administered he does have some congestion although no sign of distress-also will continue the Mucinex--continue to monitor his status closely vital signs appear to be stable Of note he also continues on Flonase   #2--history of  BPH-continues on Flomax-apparently urinary frequency has improved somewhat--he is followed by urology --I did speak with his wife today at bedside-he is followed by Dr. Diona Fanti will write an order for follow-up.  Clinically patient appears stable although vulnerable individual he is on nebulizers as well as Mucinex still has some chest congestion here but no sign of distress this will have to be watched however  [id number]-of note greater than 25 minutes spent assessing patient-discussing his status with nursing staff as well as with his wife at bedside-reviewing his chart and labs-and coordinating plan of care

## 2015-10-01 ENCOUNTER — Encounter: Payer: Self-pay | Admitting: Primary Care

## 2015-10-01 ENCOUNTER — Ambulatory Visit (INDEPENDENT_AMBULATORY_CARE_PROVIDER_SITE_OTHER): Payer: Medicare Other | Admitting: Urology

## 2015-10-01 DIAGNOSIS — Z8551 Personal history of malignant neoplasm of bladder: Secondary | ICD-10-CM | POA: Diagnosis not present

## 2015-10-01 DIAGNOSIS — N401 Enlarged prostate with lower urinary tract symptoms: Secondary | ICD-10-CM

## 2015-10-01 DIAGNOSIS — N3942 Incontinence without sensory awareness: Secondary | ICD-10-CM | POA: Diagnosis not present

## 2015-10-01 NOTE — Progress Notes (Unsigned)
Patient ID: [name], male   DOB: Nov 11, 1926, 82 y.o.   MRN: [id number] attempted to meet with Mr. Madalyn Rob today. He was unavailable (in the bathroom). His wife is at bedside, but we were unable to speak as they were delivering lunch trays at the time. I will continue to reach out to this family.

## 2015-10-02 ENCOUNTER — Non-Acute Institutional Stay (HOSPITAL_BASED_OUTPATIENT_CLINIC_OR_DEPARTMENT_OTHER): Payer: Medicare Other | Admitting: Primary Care

## 2015-10-02 ENCOUNTER — Encounter: Payer: Self-pay | Admitting: Primary Care

## 2015-10-02 DIAGNOSIS — J189 Pneumonia, unspecified organism: Secondary | ICD-10-CM

## 2015-10-02 NOTE — Progress Notes (Unsigned)
Patient ID: [name], male   DOB: 06-21-1927, 82 y.o.   MRN: [id number]  Consultation Note Date: 10/02/2015   Patient Name: [name]  DOB: Feb 28, 1927  MRN: [id number]  Age / Sex: 82 y.o., male   PCP: Redmond School, MD Referring Physician: No att. providers found  Reason for Consultation: Establishing goals of care and Psychosocial/spiritual support  Palliative Care Assessment and Plan Summary of Established Goals of Care and Medical Treatment Preferences   Clinical Assessment/Narrative: Mr. [name] is an 82 year old male with a history of right femur fracture, Gerd, dysphasia, and bladder cancer. He had a right femur fracture with fixation approximately February 2017. He went to [facility] for rehab, but has since been transition to residential. As I enter today he is sitting with quietly in his room with his head in his hand. He only briefly makes eye contact. He is unable to tell me where we are, or the month.  We talk about his life in the skilled nursing home, he becomes tearful several times.   His wife enters the room, and I introduce myself. I share that I am here today to be an extra layer of support, to answer questions about her husband's health, and to help make a good plan for his future. She shares that she does not think that I will be able to help her, and goes on to tell stories about Mr. Mogul' care in the facility. Some of the statements relate back to when he was first admitted. I encourage [name] to share her concerns with the director of nursing.  I ask if she's ever cared for someone who is a resident of the skilled nursing facility. She goes on to share her husband's mothers experience in [facility]. We talk about the difficulties of residential SNF living. [name] states that she cannot care for her husband, and realizes that the aids have 8 patients at times.   I redirect her to his current health concerns, including being wheel chair bound and  the associated complications.  She shares that he recently had the flu which turned into pneumonia, and I share that pneumonia can also be a complication, at times, from immobility.  I encourage [name] that I am available to discuss his medical concerns at any time. [name] returns to talking about his care at the facility, including how she has moved him to a semi private room to save money and "doesn't feel like I'm getting what I'm paying for".  I encourage [name] that she always has choices for care.   Contacts/Participants in Discussion: Primary Decision Maker: [name] is unable to make health care decisions at this time. His wife Aruther Holder is the primary decision maker. HCPOA: yes  [name] tells me that she has both adorable and healthcare power of attorney.  Code Status/Advance Care Planning:  full code at this time, [name] is unwilling to discuss these details with me today.  Symptom Management:   per primary care provider  Additional Recommendations (Limitations, Scope, Preferences):  unsure of any limitations at this time. Continues as full code, without the benefit of discussion.  Psycho-social/Spiritual:   Support System: [name] has lived at [facility] skilled nursing facility since his right hip fracture. At first he came for rehab, but is now considered residential. His wife comes to visit almost daily. He does have a grandson who participates in his care.  Desire for further Chaplaincy support:no  Prognosis:  Unable to determine, likely 6-12 months due to dementia, chronic decline, wheelchair-bound status.  Discharge Planning:  Continue as residential SNF.       Chief Complaint/History of Present Illness: No current complaints at this time.  Request palliative consult for goals of care meeting and support of family.  Primary Diagnoses  1. @PROBSPOA @  Palliative Review of Systems: Unable to complete due to a dementia. I have reviewed the  medical record, interviewed the patient and family, and examined the patient. The following aspects are pertinent.  Past Medical History  Diagnosis Date  . Anxiety   . Bladder cancer ([place]) 2008  . Arthritis     Hands and ankles  . History of DVT (deep vein thrombosis) 2008  . BPH (benign prostatic hyperplasia)   . GERD (gastroesophageal reflux disease)   . Sleep apnea     Stop Bang score of 5   Social History   Social History  . Marital Status: Married    Spouse Name: N/A  . Number of Children: N/A  . Years of Education: N/A   Social History Main Topics  . Smoking status: Former Smoker -- 39 years    Types: Cigarettes    Quit date: 07/05/1979  . Smokeless tobacco: Not on file  . Alcohol Use: No  . Drug Use: No  . Sexual Activity: Not on file   Other Topics Concern  . Not on file   Social History Narrative   No family history on file. Scheduled Meds: Continuous Infusions: PRN Meds:. Medications Prior to Admission:  Prior to Admission medications   Medication Sig Start Date End Date Taking? Authorizing Provider  acetaminophen (TYLENOL) 500 MG tablet Take 500 mg by mouth every 6 (six) hours as needed for pain.    Historical Provider, MD  albuterol (PROVENTIL) (2.5 MG/3ML) 0.083% nebulizer solution Take 2.5 mg by nebulization every 6 (six) hours as needed for wheezing or shortness of breath.    Historical Provider, MD  ALPRAZolam Duanne Moron) 0.5 MG tablet Take 1 tablet (0.5 mg total) by mouth 3 (three) times daily as needed for anxiety or sleep. Patient taking differently: Take 0.25 mg by mouth 3 (three) times daily as needed for anxiety or sleep.  07/04/15   Tiffany L Reed, DO  aspirin EC 325 MG EC tablet Take 1 tablet (325 mg total) by mouth daily with breakfast. 07/02/15   Mcarthur Rossetti, MD  bisacodyl (DULCOLAX) 5 MG EC tablet Take 5 mg by mouth daily as needed for moderate constipation.     Historical Provider, MD  diphenhydrAMINE (BENADRYL) 25 MG tablet Take 25 mg  by mouth every 6 (six) hours as needed for allergies.    Historical Provider, MD  fluticasone (FLONASE) 50 MCG/ACT nasal spray Place 2 sprays into both nostrils as needed for allergies or rhinitis.    Historical Provider, MD  HYDROcodone-acetaminophen (NORCO/VICODIN) 5-325 MG tablet Take 1 tablets by mouth every 4 hours as needed for moderate pain. Max APAP 3gm/24hrs from all sources 09/04/15   Estill Dooms, MD  omeprazole (PRILOSEC) 20 MG capsule Take 20 mg by mouth daily.    Historical Provider, MD  tamsulosin (FLOMAX) 0.4 MG CAPS Take 0.4 mg by mouth daily.    Historical Provider, MD   Allergies  Allergen Reactions  . Cephalosporins     unknown  . Lovenox [Enoxaparin]     Severe nausea and vomiting   . Penicillins Swelling    Has patient had a PCN reaction causing immediate rash,  facial/tongue/throat swelling, SOB or lightheadedness with hypotension: unknown Has patient had a PCN reaction causing severe rash involving mucus membranes or skin necrosis: unknown Has patient had a PCN reaction that required hospitalization : unknown Has patient had a PCN reaction occurring within the last 10 years: unknown If all of the above answers are "NO", then may proceed with Cephalosporin use.   . Sulfa Antibiotics Hives and Rash   CBC:    Component Value Date/Time   WBC 7.4 09/25/2015 0715   HGB 12.9* 09/25/2015 0715   HCT 39.1 09/25/2015 0715   PLT 194 09/25/2015 0715   MCV 94.9 09/25/2015 0715   NEUTROABS 4.4 09/25/2015 0715   LYMPHSABS 2.0 09/25/2015 0715   MONOABS 0.7 09/25/2015 0715   EOSABS 0.2 09/25/2015 0715   BASOSABS 0.0 09/25/2015 0715   Comprehensive Metabolic Panel:    Component Value Date/Time   NA 140 09/25/2015 0715   K 3.7 09/25/2015 0715   CL 103 09/25/2015 0715   CO2 28 09/25/2015 0715   BUN 25* 09/25/2015 0715   CREATININE 0.77 09/25/2015 0715   GLUCOSE 99 09/25/2015 0715   CALCIUM 8.5* 09/25/2015 0715   AST 17 09/25/2015 0715   ALT 8* 09/25/2015 0715    ALKPHOS 56 09/25/2015 0715   BILITOT 1.1 09/25/2015 0715   PROT 6.0* 09/25/2015 0715   ALBUMIN 3.2* 09/25/2015 0715    Physical Exam: Vital Signs: There were no vitals taken for this visit. SpO2:   O2 Device:   O2 Flow Rate:   Intake/output summary: @IOBRIEF @ LBM:   Baseline Weight:   Most recent weight:    Exam Findings:  Frail and thin, sitting up in wheelchair. Repeatedly holds his head with his hand. Only briefly making eye contact.         Palliative Performance Scale: 50%              Additional Data Reviewed: No results for input(s): WBC, HGB, PLT, NA, BUN, CREATININE in the last 72 hours.  Invalid input(s): ALB   Time In:  0950 Time Out: 1025 Time Total: 35 minutes Greater than 50%  of this time was spent counseling and coordinating care related to the above assessment and plan.  Signed by: Drue Novel, NP  Drue Novel, NP  10/02/2015, 12:59 PM  Please contact Palliative Medicine Team phone at [phone] for questions and concerns.

## 2015-10-07 ENCOUNTER — Other Ambulatory Visit: Payer: Self-pay

## 2015-10-08 ENCOUNTER — Other Ambulatory Visit: Payer: Self-pay

## 2015-10-08 MED ORDER — ALPRAZOLAM 0.5 MG PO TABS: 0.5000 mg | ORAL_TABLET | Freq: Three times a day (TID) | ORAL | Status: DC | PRN

## 2015-10-08 NOTE — Telephone Encounter (Signed)
Rx faxed to [facility] at [phone].   Phone #: [phone]  

## 2015-10-11 ENCOUNTER — Encounter: Payer: Self-pay | Admitting: Internal Medicine

## 2015-10-11 ENCOUNTER — Encounter (HOSPITAL_COMMUNITY)
Admission: AD | Admit: 2015-10-11 | Discharge: 2015-10-11 | Disposition: A | Discharge status: Home / Self Care | Payer: Medicare Other | Source: Skilled Nursing Facility | Attending: Internal Medicine | Admitting: Internal Medicine

## 2015-10-11 ENCOUNTER — Non-Acute Institutional Stay (SKILLED_NURSING_FACILITY): Payer: Medicare Other | Admitting: Internal Medicine

## 2015-10-11 DIAGNOSIS — Z8551 Personal history of malignant neoplasm of bladder: Secondary | ICD-10-CM | POA: Diagnosis not present

## 2015-10-11 DIAGNOSIS — R319 Hematuria, unspecified: Secondary | ICD-10-CM | POA: Diagnosis not present

## 2015-10-11 DIAGNOSIS — S72141D Displaced intertrochanteric fracture of right femur, subsequent encounter for closed fracture with routine healing: Secondary | ICD-10-CM | POA: Diagnosis not present

## 2015-10-11 DIAGNOSIS — N400 Enlarged prostate without lower urinary tract symptoms: Secondary | ICD-10-CM | POA: Diagnosis not present

## 2015-10-11 LAB — URINALYSIS, ROUTINE W REFLEX MICROSCOPIC
BILIRUBIN URINE: NEGATIVE
GLUCOSE, UA: NEGATIVE mg/dL
Ketones, ur: NEGATIVE mg/dL
Leukocytes, UA: NEGATIVE
Nitrite: NEGATIVE
PH: 7 (ref 5.0–8.0)
Protein, ur: 100 mg/dL — AB
SPECIFIC GRAVITY, URINE: 1.015 (ref 1.005–1.030)

## 2015-10-11 LAB — URINE MICROSCOPIC-ADD ON
Bacteria, UA: NONE SEEN
Squamous Epithelial / LPF: NONE SEEN

## 2015-10-11 NOTE — Progress Notes (Signed)
Patient ID: [name], male   DOB: 11-27-1926, 82 y.o.   MRN: [id number]  Location:  [facility]   Place of Service:  SNF (31)  Glo Herring., MD  Patient Care Team: Redmond School, MD as PCP - General (Internal Medicine) Satira Sark, MD as Consulting Physician (Cardiology)  Extended Emergency Contact Information Primary Emergency Contact: [facility] Address: [address]          [place], [place] [address] [place] Phone: [phone] Relation: Spouse Secondary Emergency Contact: Ulyses Jarred [place] Mobile Phone: [phone] Relation: Son Goals of care: Advanced Directive information Advanced Directives 10/11/2015  Does patient have an advance directive? Yes  Type of Advance Directive Living will  Does patient want to make changes to advanced directive? No - Patient declined  Copy of advanced directive(s) in chart? Yes  Would patient like information on creating an advanced directive? -     Chief Complaint  Patient presents with  . Acute Visit  Secondary to hematuria  HPI:  Pt is a 82 y.o. male seen today for an acute visit for hematuria-apparently when patient urinates he has been having some blood clots-he does have an history of BPH as well as bladder cancer and is followed by urology.  I do note he is on aspirin 325 mg a day as well  Currently he is not complaining of any significant dysuria or suprapubic pain or fever or chills he is a poor historian however   Past Medical History  Diagnosis Date  . Anxiety   . Bladder cancer ([place]) 2008  . Arthritis     Hands and ankles  . History of DVT (deep vein thrombosis) 2008  . BPH (benign prostatic hyperplasia)   . GERD (gastroesophageal reflux disease)   . Sleep apnea     Stop Bang score of 5   Past Surgical History  Procedure Laterality Date  . Prostate surgery  2004  . Bladder surgery  2008  . Inguinal hernia repair Left 09/05/2012   Procedure: HERNIA REPAIR INGUINAL ADULT;  Surgeon: Jamesetta So, MD;  Location: [facility];  Service: General;  Laterality: Left;  Left Inguinal Herniorraphy  . Insertion of mesh Left 09/05/2012    Procedure: INSERTION OF MESH;  Surgeon: Jamesetta So, MD;  Location: [facility];  Service: General;  Laterality: Left;  . Transurethral resection of prostate N/A 05/16/2013    Procedure: TRANSURETHRAL RESECTION OF THE PROSTATE (TURP);  Surgeon: Marissa Nestle, MD;  Location: [facility];  Service: Urology;  Laterality: N/A;  . Femur im nail Right 07/01/2015    Procedure: INTRAMEDULLARY (IM) NAIL FEMORAL;  Surgeon: Mcarthur Rossetti, MD;  Location: [facility];  Service: Orthopedics;  Laterality: Right;    Allergies  Allergen Reactions  . Cephalosporins     unknown  . Lovenox [Enoxaparin]     Severe nausea and vomiting   . Penicillins Swelling    Has patient had a PCN reaction causing immediate rash, facial/tongue/throat swelling, SOB or lightheadedness with hypotension: unknown Has patient had a PCN reaction causing severe rash involving mucus membranes or skin necrosis: unknown Has patient had a PCN reaction that required hospitalization : unknown Has patient had a PCN reaction occurring within the last 10 years: unknown If all of the above answers are "NO", then may proceed with Cephalosporin use.   . Sulfa Antibiotics Hives and Rash   Medications reviewed per MAR.  Include aspirin 325 mg daily.  Colace  100 mg twice a day.  Tylenol 500 mg every 6 hours when necessary.  Flonase 2 sprays each nostril when necessary twice a day.  Vicodin 5-3 25 mg 1 tab every 4 hours when necessary   ipratropium nebulizers every 6 hours when necessary.  Prilosec 20 mg daily.  Senokot daily at bedtime.  Flomax 0.4 mg every morning.  Xanax 0.25 mg 3 times a day when necessary anxiety.       Review of Systems-this is limited secondary to patient being a poor strain.  General does not complaining any  fever or chills.  Skin is not complaining of any rashes or itching.  Head ears eyes nose mouth and throat no complaints of visual changes or sore throat.  Respiration is not complaining of shortness breath or cough.  Cardiac no chest pain.  GI is not complaining of abdominal discomfort nausea or vomiting diarrhea or constipation.  GU as noted above does have hematuria is not really complaining of suprapubic pain or burning however.  Muscle skeletal does not complain of joint pain currently.   neurologic is not complaining of dizziness or headache.    Immunization History  Administered Date(s) Administered  . Tdap 08/07/2015   Pertinent  Health Maintenance Due  Topic Date Due  . PNA vac Low Risk Adult (1 of 2 - PCV13) 02/08/1994  . INFLUENZA VACCINE  01/21/2016   No flowsheet data found. Functional Status Survey:    Filed Vitals:   10/11/15 1446  BP: 118/56  Pulse: 82  Temp: 97.8 F (36.6 C)  TempSrc: Oral  Resp: 19   There is no weight on file to calculate BMI. Physical Exam   In general this is a frail elderly male in no distress.  Actually appears somewhat less confused than I have seen him in the past.  His skin is warm and dry.  Chest is clear to auscultation there is no labored breathing.  Heart is regular with occasional irregular beats he has mild lower extremity edema.  Abdomen soft nontender positive bowel sounds.  GU possibly some mild suprapubic tenderness.  I do not see any active drainage or bleeding from the penis at this time.  Muscle skeletal moves all extremities 4 ambulates in a wheelchair strength appears to be intact all 4 extremities.  Psych he is oriented to self again appears somewhat less confused than I am used to seeing him he is pleasant and interactive.      Labs reviewed:  Recent Labs  08/08/15 0700 08/26/15 1700 09/25/15 0715  NA 141 136 140  K 4.0 3.6 3.7  CL 105 102 103  CO2 26 26 28   GLUCOSE 97 111* 99    BUN 18 18 25*  CREATININE 0.63 0.63 0.77  CALCIUM 9.0 8.6* 8.5*    Recent Labs  06/30/15 1910 08/26/15 1700 09/25/15 0715  AST 24 19 17   ALT 7* 13* 8*  ALKPHOS 80 67 56  BILITOT 1.0 1.2 1.1  PROT 6.2* 6.3* 6.0*  ALBUMIN 3.8 3.3* 3.2*    Recent Labs  08/26/15 1700 08/28/15 0835 09/25/15 0715  WBC 12.5* 9.8 7.4  NEUTROABS 9.3* 7.7 4.4  HGB 13.2 13.1 12.9*  HCT 40.1 40.4 39.1  MCV 95.5 95.7 94.9  PLT 317 369 194   Lab Results  Component Value Date   TSH 1.173 08/28/2015   No results found for: HGBA1C No results found for: CHOL, HDL, LDLCALC, LDLDIRECT, TRIG, CHOLHDL  Significant Diagnostic Results in last 30 days:  Dg Chest 2 View  09/24/2015  CLINICAL DATA:  Cough and congestion. EXAM: CHEST  2 VIEW COMPARISON:  08/25/2015. FINDINGS: Mediastinum and hilar structures normal. Mild left lower lobe infiltrate consistent pneumonia. No pleural effusion or pneumothorax. Mild cardiomegaly. No evidence of overt congestive heart failure. IMPRESSION: Mild left lower lobe infiltrate consistent pneumonia. Electronically Signed   By: Marcello Moores  Register   On: 09/24/2015 15:37    Assessment/Plan   #1 hematuria-will order a UA C&S-with his history of bladder carcinoma certainly concern here will order a urology consult as soon as possible as well.  He will need an updated CBC and metabolic panel tomorrow to assess renal function and hemoglobin status.  We will reduce his aspirin from 325 mg 281 mg a day secondary to the bleeding.   history of BPH he does continue on Flomax  [phone]

## 2015-10-12 ENCOUNTER — Encounter (HOSPITAL_COMMUNITY)
Admission: RE | Admit: 2015-10-12 | Discharge: 2015-10-12 | Disposition: A | Discharge status: Home / Self Care | Payer: Medicare Other | Source: Skilled Nursing Facility | Attending: Internal Medicine | Admitting: Internal Medicine

## 2015-10-12 DIAGNOSIS — N400 Enlarged prostate without lower urinary tract symptoms: Secondary | ICD-10-CM | POA: Insufficient documentation

## 2015-10-12 LAB — BASIC METABOLIC PANEL
Anion gap: 7 (ref 5–15)
BUN: 17 mg/dL (ref 6–20)
CALCIUM: 9.1 mg/dL (ref 8.9–10.3)
CO2: 27 mmol/L (ref 22–32)
Chloride: 104 mmol/L (ref 101–111)
Creatinine, Ser: 0.68 mg/dL (ref 0.61–1.24)
GFR calc Af Amer: >60 mL/min (ref >60)
GLUCOSE: 94 mg/dL (ref 65–99)
POTASSIUM: 4.3 mmol/L (ref 3.5–5.1)
SODIUM: 138 mmol/L (ref 135–145)

## 2015-10-12 LAB — CBC WITH DIFFERENTIAL/PLATELET
BASOS ABS: 0.1 10*3/uL (ref 0.0–0.1)
BASOS PCT: 2 %
EOS ABS: 0.3 10*3/uL (ref 0.0–0.7)
Eosinophils Relative: 5 %
HCT: 41.1 % (ref 39.0–52.0)
HEMOGLOBIN: 13.6 g/dL (ref 13.0–17.0)
Lymphocytes Relative: 39 %
Lymphs Abs: 2 10*3/uL (ref 0.7–4.0)
MCH: 30.6 pg (ref 26.0–34.0)
MCHC: 33.1 g/dL (ref 30.0–36.0)
MCV: 92.4 fL (ref 78.0–100.0)
Monocytes Absolute: 0.4 10*3/uL (ref 0.1–1.0)
Monocytes Relative: 7 %
Neutro Abs: 2.4 10*3/uL (ref 1.7–7.7)
Neutrophils Relative %: 47 %
Platelets: 246 10*3/uL (ref 150–400)
RBC: 4.45 MIL/uL (ref 4.22–5.81)
RDW: 14 % (ref 11.5–15.5)
WBC: 5.1 10*3/uL (ref 4.0–10.5)

## 2015-10-14 LAB — URINE CULTURE

## 2015-10-15 ENCOUNTER — Ambulatory Visit (INDEPENDENT_AMBULATORY_CARE_PROVIDER_SITE_OTHER): Payer: Medicare Other | Admitting: Urology

## 2015-10-15 DIAGNOSIS — C679 Malignant neoplasm of bladder, unspecified: Secondary | ICD-10-CM | POA: Diagnosis not present

## 2015-10-15 DIAGNOSIS — N401 Enlarged prostate with lower urinary tract symptoms: Secondary | ICD-10-CM

## 2015-10-15 DIAGNOSIS — R310 Gross hematuria: Secondary | ICD-10-CM

## 2015-10-16 ENCOUNTER — Ambulatory Visit (HOSPITAL_COMMUNITY): Admit: 2015-10-16 | Payer: Medicare Other

## 2015-10-18 ENCOUNTER — Ambulatory Visit (HOSPITAL_COMMUNITY)
Admission: RE | Admit: 2015-10-18 | Discharge: 2015-10-18 | Disposition: A | Discharge status: Home / Self Care | Payer: Medicare Other | Source: Ambulatory Visit | Attending: Urology | Admitting: Urology

## 2015-10-18 DIAGNOSIS — I700 Atherosclerosis of aorta: Secondary | ICD-10-CM | POA: Diagnosis not present

## 2015-10-18 DIAGNOSIS — K5730 Diverticulosis of large intestine without perforation or abscess without bleeding: Secondary | ICD-10-CM | POA: Diagnosis not present

## 2015-10-18 DIAGNOSIS — R109 Unspecified abdominal pain: Secondary | ICD-10-CM | POA: Diagnosis present

## 2015-10-18 DIAGNOSIS — N400 Enlarged prostate without lower urinary tract symptoms: Secondary | ICD-10-CM | POA: Diagnosis not present

## 2015-10-18 MED ORDER — IOPAMIDOL (ISOVUE-300) INJECTION 61%
125.0000 mL | Freq: Once | INTRAVENOUS | Status: AC | PRN
  Administered 2015-10-18: 125 mL via INTRAVENOUS

## 2015-10-19 NOTE — Progress Notes (Signed)
Patient ID: [name], male   DOB: Jul 30, 1926, 82 y.o.   MRN: [id number]

## 2015-10-23 ENCOUNTER — Non-Acute Institutional Stay (SKILLED_NURSING_FACILITY): Payer: Medicare Other | Admitting: Internal Medicine

## 2015-10-23 DIAGNOSIS — R319 Hematuria, unspecified: Secondary | ICD-10-CM | POA: Diagnosis not present

## 2015-10-23 DIAGNOSIS — M545 Low back pain: Secondary | ICD-10-CM

## 2015-10-23 NOTE — Progress Notes (Signed)
Patient ID: [name], male   DOB: November 23, 1926, 82 y.o.   MRN: [id number]    Patient ID: [name], male   DOB: 1926-12-14, 82 y.o.   MRN: [id number]  Location:  [facility] Room Number: [id number] Place of Service:  SNF (31)  Glo Herring., MD  Patient Care Team: Redmond School, MD as PCP - General (Internal Medicine) Satira Sark, MD as Consulting Physician (Cardiology)  Extended Emergency Contact Information Primary Emergency Contact: [facility] Address: [address]          [place], [place] [address] [place] Phone: [phone] Relation: Spouse Secondary Emergency Contact: [name] [place] Mobile Phone: [phone] Relation: Son Goals of care: Advanced Directive information Advanced Directives 10/23/2015  Does patient have an advance directive? Yes  Type of Advance Directive (No Data)  Does patient want to make changes to advanced directive? No - Patient declined  Copy of advanced directive(s) in chart? Yes     Chief Complaint  Patient presents with  . Acute Visit    [place] and complains of Back pain  Follow-up hematuria  HPI:  Pt is a 82 y.o. male with a history of BPH as well as bladder cancer-he is followed by urology.  He has had recently some hematuria this subsided but now appears to be reoccurring again-he has seen urology and they ordered a CT of the abdomen which did not show any acute process.  His hemoglobin has remained stable as well.  He was on aspirin 325 mg a day this has been reduced to 81 mg a day because of the intermittent hematuria.  A urinalysis has been done which was unremarkable.  Patient also had a recent fall had complain of some back pain-but speaking with him today he denies any back pain says he is feeling significantly better.  His vital signs remained stable he is afebrile he does not really complain specifically of dysuria or burning with  urination.      Past Medical History  Diagnosis Date  . Anxiety   . Bladder cancer ([place]) 2008  . Arthritis     Hands and ankles  . History of DVT (deep vein thrombosis) 2008  . BPH (benign prostatic hyperplasia)   . GERD (gastroesophageal reflux disease)   . Sleep apnea     Stop Bang score of 5   Past Surgical History  Procedure Laterality Date  . Prostate surgery  2004  . Bladder surgery  2008  . Inguinal hernia repair Left 09/05/2012    Procedure: HERNIA REPAIR INGUINAL ADULT;  Surgeon: Jamesetta So, MD;  Location: [facility];  Service: General;  Laterality: Left;  Left Inguinal Herniorraphy  . Insertion of mesh Left 09/05/2012    Procedure: INSERTION OF MESH;  Surgeon: Jamesetta So, MD;  Location: [facility];  Service: General;  Laterality: Left;  . Transurethral resection of prostate N/A 05/16/2013    Procedure: TRANSURETHRAL RESECTION OF THE PROSTATE (TURP);  Surgeon: Marissa Nestle, MD;  Location: [facility];  Service: Urology;  Laterality: N/A;  . Femur im nail Right 07/01/2015    Procedure: INTRAMEDULLARY (IM) NAIL FEMORAL;  Surgeon: Mcarthur Rossetti, MD;  Location: [facility];  Service: Orthopedics;  Laterality: Right;    Allergies  Allergen Reactions  . Cephalosporins     unknown  . Lovenox [Enoxaparin]     Severe nausea and vomiting   . Penicillins Swelling    Has patient had  a PCN reaction causing immediate rash, facial/tongue/throat swelling, SOB or lightheadedness with hypotension: unknown Has patient had a PCN reaction causing severe rash involving mucus membranes or skin necrosis: unknown Has patient had a PCN reaction that required hospitalization : unknown Has patient had a PCN reaction occurring within the last 10 years: unknown If all of the above answers are "NO", then may proceed with Cephalosporin use.   . Sulfa Antibiotics Hives and Rash   Medications reviewed per MAR.  Include aspirin 81 mg daily.  Colace 100 mg twice a day.  Tylenol 500 mg every  6 hours when necessary.  Flonase 2 sprays each nostril when necessary twice a day.  Vicodin 5-3 25 mg 1 tab every 4 hours when necessary   ipratropium nebulizers every 6 hours when necessary.  Prilosec 20 mg daily.  Senokot daily at bedtime.  Flomax 0.4 mg every morning.  Xanax 0.25 mg 3 times a day when necessary anxiety.       Review of Systems-this is limited secondary to patient being a poor historian  General does not complaining any fever or chills.  Skin is not complaining of any rashes or itching.  Head ears eyes nose mouth and throat no complaints of visual changes or sore throat.  Respiration is not complaining of shortness breath or cough.  Cardiac no chest pain.  GI is not complaining of abdominal discomfort nausea or vomiting diarrhea or constipation.  GU as noted above does have hematuria is not really complaining of suprapubic pain or burning however.  Muscle skeletal does not complain of joint pain currently.   neurologic is not complaining of dizziness or headache.    Immunization History  Administered Date(s) Administered  . Tdap 08/07/2015   Pertinent  Health Maintenance Due  Topic Date Due  . PNA vac Low Risk Adult (1 of 2 - PCV13) 02/08/1994  . INFLUENZA VACCINE  01/21/2016   No flowsheet data found. Functional Status Survey:    Filed Vitals:   10/23/15 1143  BP: 125/71  Pulse: 66  Temp: 98.3 F (36.8 C)  TempSrc: Oral  Resp: 20  Height: 5\' 6"  (1.676 m)  Weight: 135 lb (61.236 kg)  SpO2: 96%   Body mass index is 21.8 kg/(m^2). Physical Exam   In general this is a frail elderly male in no distress.  Remains mildly confused but quite pleasant.  His skin is warm and dry.  Chest is clear to auscultation there is no labored breathing.  Heart is regular with occasional irregular beats he has mild lower extremity edema.  Abdomen soft nontender positive bowel sounds.  GU could not really appreciate suprapubic tenderness  or  I do not see any active drainage or bleeding from the penis at this time.  Muscle skeletal moves all extremities 4 ambulates in a wheelchair strength appears to be intact all 4 extremities is some minimal tenderness palpation of the upper lumbar lower thoracic area he says this is significantly better--.  Psych he is oriented to self  he is pleasant and interactive.      Labs reviewed:  Recent Labs  09/25/15 0715 10/12/15 1230 10/24/15 0720  NA 140 138 137  K 3.7 4.3 4.0  CL 103 104 102  CO2 28 27 24   GLUCOSE 99 94 95  BUN 25* 17 17  CREATININE 0.77 0.68 0.57*  CALCIUM 8.5* 9.1 8.8*    Recent Labs  06/30/15 1910 08/26/15 1700 09/25/15 0715  AST 24 19 17   ALT 7*  13* 8*  ALKPHOS 80 67 56  BILITOT 1.0 1.2 1.1  PROT 6.2* 6.3* 6.0*  ALBUMIN 3.8 3.3* 3.2*    Recent Labs  09/25/15 0715 10/12/15 1500 10/24/15 0720  WBC 7.4 5.1 5.9  NEUTROABS 4.4 2.4 3.2  HGB 12.9* 13.6 12.8*  HCT 39.1 41.1 39.1  MCV 94.9 92.4 92.2  PLT 194 246 214   Lab Results  Component Value Date   TSH 1.173 08/28/2015   No results found for: HGBA1C No results found for: CHOL, HDL, LDLCALC, LDLDIRECT, TRIG, CHOLHDL  Significant Diagnostic Results in last 30 days:  Ct Abdomen Pelvis W Wo Contrast  10/18/2015  CLINICAL DATA:  Gross hematuria.  Generalized abdominal pain. EXAM: CT ABDOMEN AND PELVIS WITHOUT AND WITH CONTRAST TECHNIQUE: Multidetector CT imaging of the abdomen and pelvis was performed following the standard protocol before and following the bolus administration of intravenous contrast. CONTRAST:  153mL ISOVUE-300 IOPAMIDOL (ISOVUE-300) INJECTION 61% COMPARISON:  CT scan of May 06, 2010. FINDINGS: Old compression fracture of T12 is noted. Visualized lung bases are unremarkable. No gallstones are noted.  No renal or ureteral calculi are noted. The liver, spleen and pancreas are unremarkable. Adrenal glands appear normal. Two simple left renal cysts are noted. Otherwise  kidneys appear normal. No hydronephrosis or renal obstruction is noted. Atherosclerosis of abdominal aorta is noted without aneurysm formation. There is no evidence of bowel obstruction. No abnormal fluid collection is noted. Sigmoid diverticulosis is noted without inflammation. Urinary bladder is unremarkable. Moderate prostatic enlargement is noted. IMPRESSION: No hydronephrosis or renal obstruction is noted. No renal or ureteral calculi are noted. Atherosclerosis of abdominal aorta is noted without aneurysm formation. Sigmoid diverticulosis is noted without inflammation. Moderate prostatic enlargement is noted. Electronically Signed   By: Marijo Conception, M.D.   On: 10/18/2015 19:32    Assessment/Plan  #1 continued hematuria-have spoken to nursing to contact urology for follow-up.  again CT scan of the abdomen is reassuring as well as recent urinalysis which did not indicated infection.  His hemoglobin remained stable but we will need to recheck this-also will await urology input on this.  Also will update a metabolic panel.  #2 back pain this appears to be resolving physical exam was fairly benign he says the pain is essentially gone-at this point will monitor continues to ambulate about facility in his wheelchair without difficulty.  [id number]

## 2015-10-24 ENCOUNTER — Encounter (HOSPITAL_COMMUNITY)
Admission: AD | Admit: 2015-10-24 | Discharge: 2015-10-24 | Disposition: A | Discharge status: Home / Self Care | Payer: Medicare Other | Source: Skilled Nursing Facility | Attending: Internal Medicine | Admitting: Internal Medicine

## 2015-10-24 DIAGNOSIS — R350 Frequency of micturition: Secondary | ICD-10-CM | POA: Diagnosis present

## 2015-10-24 DIAGNOSIS — D090 Carcinoma in situ of bladder: Secondary | ICD-10-CM | POA: Insufficient documentation

## 2015-10-24 DIAGNOSIS — R5383 Other fatigue: Secondary | ICD-10-CM | POA: Diagnosis not present

## 2015-10-24 DIAGNOSIS — S72141D Displaced intertrochanteric fracture of right femur, subsequent encounter for closed fracture with routine healing: Secondary | ICD-10-CM | POA: Diagnosis present

## 2015-10-24 LAB — BASIC METABOLIC PANEL
ANION GAP: 11 (ref 5–15)
BUN: 17 mg/dL (ref 6–20)
CHLORIDE: 102 mmol/L (ref 101–111)
CO2: 24 mmol/L (ref 22–32)
Calcium: 8.8 mg/dL — ABNORMAL LOW (ref 8.9–10.3)
Creatinine, Ser: 0.57 mg/dL — ABNORMAL LOW (ref 0.61–1.24)
GFR calc non Af Amer: >60 mL/min (ref >60)
Glucose, Bld: 95 mg/dL (ref 65–99)
POTASSIUM: 4 mmol/L (ref 3.5–5.1)
SODIUM: 137 mmol/L (ref 135–145)

## 2015-10-24 LAB — CBC WITH DIFFERENTIAL/PLATELET
BASOS ABS: 0.1 10*3/uL (ref 0.0–0.1)
BASOS PCT: 1 %
EOS ABS: 0.4 10*3/uL (ref 0.0–0.7)
EOS PCT: 7 %
HEMATOCRIT: 39.1 % (ref 39.0–52.0)
HEMOGLOBIN: 12.8 g/dL — AB (ref 13.0–17.0)
LYMPHS ABS: 1.6 10*3/uL (ref 0.7–4.0)
LYMPHS PCT: 27 %
MCH: 30.2 pg (ref 26.0–34.0)
MCHC: 32.7 g/dL (ref 30.0–36.0)
MCV: 92.2 fL (ref 78.0–100.0)
MONO ABS: 0.6 10*3/uL (ref 0.1–1.0)
Monocytes Relative: 10 %
Neutro Abs: 3.2 10*3/uL (ref 1.7–7.7)
Neutrophils Relative %: 55 %
Platelets: 214 10*3/uL (ref 150–400)
RBC: 4.24 MIL/uL (ref 4.22–5.81)
RDW: 14.8 % (ref 11.5–15.5)
WBC: 5.9 10*3/uL (ref 4.0–10.5)

## 2015-10-27 NOTE — Progress Notes (Signed)
Patient ID: [name], male   DOB: Jul 30, 1926, 82 y.o.   MRN: [id number]

## 2015-11-05 ENCOUNTER — Non-Acute Institutional Stay (SKILLED_NURSING_FACILITY): Payer: Medicare Other | Admitting: Internal Medicine

## 2015-11-05 ENCOUNTER — Encounter: Payer: Self-pay | Admitting: Internal Medicine

## 2015-11-05 DIAGNOSIS — K219 Gastro-esophageal reflux disease without esophagitis: Secondary | ICD-10-CM

## 2015-11-05 DIAGNOSIS — R319 Hematuria, unspecified: Secondary | ICD-10-CM

## 2015-11-05 DIAGNOSIS — S72001D Fracture of unspecified part of neck of right femur, subsequent encounter for closed fracture with routine healing: Secondary | ICD-10-CM

## 2015-11-05 DIAGNOSIS — N400 Enlarged prostate without lower urinary tract symptoms: Secondary | ICD-10-CM | POA: Diagnosis not present

## 2015-11-05 DIAGNOSIS — J189 Pneumonia, unspecified organism: Secondary | ICD-10-CM | POA: Diagnosis not present

## 2015-11-05 NOTE — Progress Notes (Signed)
Location:  [facility] Room Number: 121/D Place of Service:  SNF (31) Provider:  Amado Coe., MD  Patient Care Team: Redmond School, MD as PCP - General (Internal Medicine) Satira Sark, MD as Consulting Physician (Cardiology)  Extended Emergency Contact Information Primary Emergency Contact: [facility] Address: [address]          [place], [place] [address] [place] Phone: [phone] Relation: Spouse Secondary Emergency Contact: Ulyses Jarred [place] Mobile Phone: [phone] Relation: Son  Code Status:  Full Code Goals of care: Advanced Directive information Advanced Directives 11/05/2015  Does patient have an advance directive? Yes  Type of Advance Directive -  Does patient want to make changes to advanced directive? No - Patient declined  Copy of advanced directive(s) in chart? Yes     Chief Complaint  Patient presents with  . Discharge Note    HPI:  Pt is a 82 y.o. male seen today For discharge.  Patient has had somewhat of an involved course here-he came here for rehabilitation after sustaining a right hip fracture which appears to be stable.  During his stay here he has at one point developed pneumonia which responded well to antibiotics and treatment.  Also was treated for influenza which appears to have resolved unremarkably.  Patient also sustained a fall with a nasal fracture which appears to have healed unremarkably as well.  Patient does have a history of bladder cancer-and recent hematuria-he has been followed closely by urology a CT scan of the bladder was reassuring-hematuria appears to have resolved most recent urine culture did not really show a significant infection although he has been treated for that in the past as well.  He continues to be pleasant although has confusion which has been the case  since he is been here this waxes and wanes to some  extent.  His vital signs continued to be stable-he will be going home with his wife who is very supportive he will need home health PT and OT support for continued strengthening still ambulates largely in a wheelchair     Past Medical History  Diagnosis Date  . Anxiety   . Bladder cancer ([place]) 2008  . Arthritis     Hands and ankles  . History of DVT (deep vein thrombosis) 2008  . BPH (benign prostatic hyperplasia)   . GERD (gastroesophageal reflux disease)   . Sleep apnea     Stop Bang score of 5   Past Surgical History  Procedure Laterality Date  . Prostate surgery  2004  . Bladder surgery  2008  . Inguinal hernia repair Left 09/05/2012    Procedure: HERNIA REPAIR INGUINAL ADULT;  Surgeon: Jamesetta So, MD;  Location: [facility];  Service: General;  Laterality: Left;  Left Inguinal Herniorraphy  . Insertion of mesh Left 09/05/2012    Procedure: INSERTION OF MESH;  Surgeon: Jamesetta So, MD;  Location: [facility];  Service: General;  Laterality: Left;  . Transurethral resection of prostate N/A 05/16/2013    Procedure: TRANSURETHRAL RESECTION OF THE PROSTATE (TURP);  Surgeon: Marissa Nestle, MD;  Location: [facility];  Service: Urology;  Laterality: N/A;  . Femur im nail Right 07/01/2015    Procedure: INTRAMEDULLARY (IM) NAIL FEMORAL;  Surgeon: Mcarthur Rossetti, MD;  Location: [facility];  Service: Orthopedics;  Laterality: Right;    Allergies  Allergen Reactions  . Cephalosporins     unknown  . Lovenox [Enoxaparin]  Severe nausea and vomiting   . Penicillins Swelling    Has patient had a PCN reaction causing immediate rash, facial/tongue/throat swelling, SOB or lightheadedness with hypotension: unknown Has patient had a PCN reaction causing severe rash involving mucus membranes or skin necrosis: unknown Has patient had a PCN reaction that required hospitalization : unknown Has patient had a PCN reaction occurring within the last 10 years: unknown If all of the above answers are  "NO", then may proceed with Cephalosporin use.   . Sulfa Antibiotics Hives and Rash    Current Outpatient Prescriptions on File Prior to Visit  Medication Sig Dispense Refill  . acetaminophen (TYLENOL) 500 MG tablet Take 500 mg by mouth every 6 (six) hours as needed for pain.    Marland Kitchen aspirin 81 MG tablet Take 81 mg by mouth daily.    Marland Kitchen BISACODYL LAXATIVE PO Take 10mg  by mouth once daily as needed for constipation    . docusate sodium (COLACE) 100 MG capsule Take 100 mg by mouth 2 (two) times daily. Hold for diarrhea    . HYDROcodone-acetaminophen (NORCO) 5-325 MG tablet Give 1 tablet by mouth every 4 hour  for mild to moderate pain. Give 2 tablets by mouth  every 4 hours for severe pain    . ipratropium (ATROVENT) 0.02 % nebulizer solution Take 0.5 mg by nebulization every 6 (six) hours as needed for wheezing or shortness of breath.    Marland Kitchen omeprazole (PRILOSEC) 20 MG capsule Take 20 mg by mouth daily.    . sennosides-docusate sodium (SENOKOT-S) 8.6-50 MG tablet Take 1 tablet by mouth at bedtime.     No current facility-administered medications on file prior to visit.  Of note he is also on PROSCARr 5 mg daily this has been prescribed by urology   Review of Systems  Limited since patient is a poor historian.  General no complaints of fever or chills.  Skin contusion nasal area appears to have resolved does not complain of rashes or itching.  Head ears eyes nose mouth and throat does not complain of visual changes or sore throat.  Respiratory no complaints of shortness breath or cough.  Cardiac no chest pain.  GI does not complaining of abdominal discomfort nausea vomiting diarrhea or constipation.  GU issues as noted above but is not complaining of dysuria today.  Musculoskeletal has generalized weakness especially lower extremities with frailty but does not complain of joint pain.  Neurologic is not complaining of dizziness headache or syncopal episodes.  Psych he does have some  history of confusion I suspect some element of dementia which apparently has progressed somewhat since his hip fracture does not appear overtly anxious or depressed  Immunization History  Administered Date(s) Administered  . Tdap 08/07/2015   Pertinent  Health Maintenance Due  Topic Date Due  . PNA vac Low Risk Adult (1 of 2 - PCV13) 11/04/2016 (Originally [birth year]/1994)  . INFLUENZA VACCINE  01/21/2016   No flowsheet data found. Functional Status Survey:    Filed Vitals:   11/05/15 1636  BP: 135/69  Pulse: 67  Temp: 98.4 F (36.9 C)  TempSrc: Oral  Resp: 20  Height: 5\' 6"  (1.676 m)  Weight: 135 lb (61.236 kg)   Body mass index is 21.8 kg/(m^2). Physical Exam   In general this is a frail elderly male in no distress ambulating in his wheelchair.  His skin is warm and dry.  He does have numerous solar induced changes.  Eyes pupils appear reactive to light sclerae  and conjunctivae are clear visual acuity appears grossly intact.  Oropharynx is clear mucous membranes moist.  Chest is clear to auscultation there is no labored breathing.  Heart is regular rate and rhythm with occasional irregular skipped beats he does not have significant lower extremity edema.  Abdomen soft nontender with positive bowel sounds.  GU cannot really appreciate significant suprapubic tenderness or distention.  Muscle skeletal does move all extremities 4 ambulates in a wheelchair about the facility and actually moves quite fast does this well.  Neurologic is grossly intact no lateralizing findings speech is clear although again he has confusion.  Psych he is oriented to self is pleasant and cooperative talkative but confused  Labs reviewed:  Recent Labs  09/25/15 0715 10/12/15 1230 10/24/15 0720  NA 140 138 137  K 3.7 4.3 4.0  CL 103 104 102  CO2 28 27 24   GLUCOSE 99 94 95  BUN 25* 17 17  CREATININE 0.77 0.68 0.57*  CALCIUM 8.5* 9.1 8.8*    Recent Labs  06/30/15 1910  08/26/15 1700 09/25/15 0715  AST 24 19 17   ALT 7* 13* 8*  ALKPHOS 80 67 56  BILITOT 1.0 1.2 1.1  PROT 6.2* 6.3* 6.0*  ALBUMIN 3.8 3.3* 3.2*    Recent Labs  09/25/15 0715 10/12/15 1500 10/24/15 0720  WBC 7.4 5.1 5.9  NEUTROABS 4.4 2.4 3.2  HGB 12.9* 13.6 12.8*  HCT 39.1 41.1 39.1  MCV 94.9 92.4 92.2  PLT 194 246 214   Lab Results  Component Value Date   TSH 1.173 08/28/2015   No results found for: HGBA1C No results found for: CHOL, HDL, LDLCALC, LDLDIRECT, TRIG, CHOLHDL  Significant Diagnostic Results in last 30 days:  Ct Abdomen Pelvis W Wo Contrast  10/18/2015  CLINICAL DATA:  Gross hematuria.  Generalized abdominal pain. EXAM: CT ABDOMEN AND PELVIS WITHOUT AND WITH CONTRAST TECHNIQUE: Multidetector CT imaging of the abdomen and pelvis was performed following the standard protocol before and following the bolus administration of intravenous contrast. CONTRAST:  140mL ISOVUE-300 IOPAMIDOL (ISOVUE-300) INJECTION 61% COMPARISON:  CT scan of May 06, 2010. FINDINGS: Old compression fracture of T12 is noted. Visualized lung bases are unremarkable. No gallstones are noted.  No renal or ureteral calculi are noted. The liver, spleen and pancreas are unremarkable. Adrenal glands appear normal. Two simple left renal cysts are noted. Otherwise kidneys appear normal. No hydronephrosis or renal obstruction is noted. Atherosclerosis of abdominal aorta is noted without aneurysm formation. There is no evidence of bowel obstruction. No abnormal fluid collection is noted. Sigmoid diverticulosis is noted without inflammation. Urinary bladder is unremarkable. Moderate prostatic enlargement is noted. IMPRESSION: No hydronephrosis or renal obstruction is noted. No renal or ureteral calculi are noted. Atherosclerosis of abdominal aorta is noted without aneurysm formation. Sigmoid diverticulosis is noted without inflammation. Moderate prostatic enlargement is noted. Electronically Signed   By: Marijo Conception, M.D.   On: 10/18/2015 19:32    Assessment/Plan  #1-history of right hip fracture He has received therapy-continues to ambulate in a wheelchair-I suspect rehabilitation is complicated with his increased confusion and dementia-will need follow-up as an outpatient.  He continues on low-dose aspirin for anticoagulation-this was reduced secondary to hematuria.  #2 history of hematuria this appears to have resolved for moment-he is followed by urology. Did prescribe Proscar-he also has a history of bladder cancer  And BPH   which is followed by urology recently had a CT scan which appeared to be reassuring.  #3 history  of UTIs-again most recent urine culture was negative hematuria appears to have resolved.  #4 history of pneumonia this is been resolved for some time and no reoccurrence versus reassuring-he is on as needed nebulizers.  #5 history of GERD continues on Prilosec this has been asymptomatic essentially.  #6 history of anxiety is on Xanax 0.25 mg 3 times a day as needed this appears to be relatively stable as well.  For pain management he does continue on Norco this appears to be well controlled.  Again patient will be going home with his wife he will need home health support and I suspect continued therapy as well-will need expedient follow-up by primary care provider.  We will update a CBC and BMP for updated values as well.  [id number] note greater than 30 minutes spent on this discharge summary-greater than 50% of time spent coordinating plan of care for numerous diagnoses        Oralia Manis, [place]

## 2015-11-06 ENCOUNTER — Encounter (HOSPITAL_COMMUNITY)
Admission: RE | Admit: 2015-11-06 | Discharge: 2015-11-06 | Disposition: A | Discharge status: Home / Self Care | Payer: Medicare Other | Source: Skilled Nursing Facility | Attending: *Deleted | Admitting: *Deleted

## 2015-11-06 DIAGNOSIS — D090 Carcinoma in situ of bladder: Secondary | ICD-10-CM | POA: Diagnosis not present

## 2015-11-06 LAB — BASIC METABOLIC PANEL
ANION GAP: 6 (ref 5–15)
BUN: 14 mg/dL (ref 6–20)
CHLORIDE: 104 mmol/L (ref 101–111)
CO2: 26 mmol/L (ref 22–32)
Calcium: 8.7 mg/dL — ABNORMAL LOW (ref 8.9–10.3)
Creatinine, Ser: 0.58 mg/dL — ABNORMAL LOW (ref 0.61–1.24)
GFR calc Af Amer: >60 mL/min (ref >60)
GLUCOSE: 83 mg/dL (ref 65–99)
POTASSIUM: 3.9 mmol/L (ref 3.5–5.1)
Sodium: 136 mmol/L (ref 135–145)

## 2015-11-06 LAB — CBC WITH DIFFERENTIAL/PLATELET
BASOS ABS: 0.1 10*3/uL (ref 0.0–0.1)
Basophils Relative: 2 %
EOS PCT: 3 %
Eosinophils Absolute: 0.2 10*3/uL (ref 0.0–0.7)
HCT: 39.2 % (ref 39.0–52.0)
HEMOGLOBIN: 12.9 g/dL — AB (ref 13.0–17.0)
LYMPHS PCT: 34 %
Lymphs Abs: 1.7 10*3/uL (ref 0.7–4.0)
MCH: 30.6 pg (ref 26.0–34.0)
MCHC: 32.9 g/dL (ref 30.0–36.0)
MCV: 92.9 fL (ref 78.0–100.0)
Monocytes Absolute: 0.4 10*3/uL (ref 0.1–1.0)
Monocytes Relative: 7 %
NEUTROS ABS: 2.7 10*3/uL (ref 1.7–7.7)
NEUTROS PCT: 54 %
PLATELETS: 300 10*3/uL (ref 150–400)
RBC: 4.22 MIL/uL (ref 4.22–5.81)
RDW: 14.6 % (ref 11.5–15.5)
WBC: 5.1 10*3/uL (ref 4.0–10.5)

## 2016-02-21 NOTE — Progress Notes (Signed)
Encounter created in error, please disregard.  

## 2016-10-28 ENCOUNTER — Ambulatory Visit (HOSPITAL_COMMUNITY)
Admission: RE | Admit: 2016-10-28 | Discharge: 2016-10-28 | Disposition: A | Discharge status: Home / Self Care | Payer: Medicare Other | Source: Ambulatory Visit | Attending: Family Medicine | Admitting: Family Medicine

## 2016-10-28 ENCOUNTER — Other Ambulatory Visit (HOSPITAL_COMMUNITY): Payer: Self-pay | Admitting: Family Medicine

## 2016-10-28 DIAGNOSIS — S99921A Unspecified injury of right foot, initial encounter: Secondary | ICD-10-CM

## 2016-10-28 DIAGNOSIS — S99911A Unspecified injury of right ankle, initial encounter: Secondary | ICD-10-CM | POA: Diagnosis present

## 2016-10-28 DIAGNOSIS — X58XXXA Exposure to other specified factors, initial encounter: Secondary | ICD-10-CM | POA: Insufficient documentation

## 2016-10-28 DIAGNOSIS — M85871 Other specified disorders of bone density and structure, right ankle and foot: Secondary | ICD-10-CM | POA: Insufficient documentation

## 2016-10-28 DIAGNOSIS — S82434A Nondisplaced oblique fracture of shaft of right fibula, initial encounter for closed fracture: Secondary | ICD-10-CM | POA: Diagnosis not present

## 2016-10-28 DIAGNOSIS — M7989 Other specified soft tissue disorders: Secondary | ICD-10-CM | POA: Diagnosis not present

## 2016-12-29 ENCOUNTER — Emergency Department (HOSPITAL_COMMUNITY): Payer: Medicare Other

## 2016-12-29 ENCOUNTER — Inpatient Hospital Stay (HOSPITAL_COMMUNITY)
Admission: EM | Admit: 2016-12-29 | Discharge: 2017-01-01 | DRG: 536 | Disposition: A | Discharge status: Hospice | Payer: Medicare Other | Attending: General Surgery | Admitting: General Surgery

## 2016-12-29 ENCOUNTER — Encounter (HOSPITAL_COMMUNITY): Payer: Self-pay | Admitting: Emergency Medicine

## 2016-12-29 ENCOUNTER — Observation Stay (HOSPITAL_COMMUNITY): Payer: Medicare Other

## 2016-12-29 DIAGNOSIS — Z515 Encounter for palliative care: Secondary | ICD-10-CM

## 2016-12-29 DIAGNOSIS — S32049A Unspecified fracture of fourth lumbar vertebra, initial encounter for closed fracture: Secondary | ICD-10-CM | POA: Diagnosis present

## 2016-12-29 DIAGNOSIS — S32040A Wedge compression fracture of fourth lumbar vertebra, initial encounter for closed fracture: Secondary | ICD-10-CM

## 2016-12-29 DIAGNOSIS — R102 Pelvic and perineal pain: Secondary | ICD-10-CM | POA: Diagnosis not present

## 2016-12-29 DIAGNOSIS — Z993 Dependence on wheelchair: Secondary | ICD-10-CM

## 2016-12-29 DIAGNOSIS — K219 Gastro-esophageal reflux disease without esophagitis: Secondary | ICD-10-CM | POA: Diagnosis present

## 2016-12-29 DIAGNOSIS — Y92121 Bathroom in nursing home as the place of occurrence of the external cause: Secondary | ICD-10-CM

## 2016-12-29 DIAGNOSIS — R52 Pain, unspecified: Secondary | ICD-10-CM

## 2016-12-29 DIAGNOSIS — Z7982 Long term (current) use of aspirin: Secondary | ICD-10-CM

## 2016-12-29 DIAGNOSIS — Z87891 Personal history of nicotine dependence: Secondary | ICD-10-CM

## 2016-12-29 DIAGNOSIS — S32401A Unspecified fracture of right acetabulum, initial encounter for closed fracture: Secondary | ICD-10-CM

## 2016-12-29 DIAGNOSIS — F0390 Unspecified dementia without behavioral disturbance: Secondary | ICD-10-CM | POA: Diagnosis present

## 2016-12-29 DIAGNOSIS — W1830XA Fall on same level, unspecified, initial encounter: Secondary | ICD-10-CM | POA: Diagnosis present

## 2016-12-29 DIAGNOSIS — W19XXXA Unspecified fall, initial encounter: Secondary | ICD-10-CM

## 2016-12-29 DIAGNOSIS — S300XXA Contusion of lower back and pelvis, initial encounter: Secondary | ICD-10-CM | POA: Diagnosis present

## 2016-12-29 DIAGNOSIS — G4730 Sleep apnea, unspecified: Secondary | ICD-10-CM | POA: Diagnosis present

## 2016-12-29 DIAGNOSIS — Z79899 Other long term (current) drug therapy: Secondary | ICD-10-CM

## 2016-12-29 DIAGNOSIS — S32019A Unspecified fracture of first lumbar vertebra, initial encounter for closed fracture: Secondary | ICD-10-CM | POA: Diagnosis present

## 2016-12-29 DIAGNOSIS — S32411A Displaced fracture of anterior wall of right acetabulum, initial encounter for closed fracture: Secondary | ICD-10-CM | POA: Diagnosis not present

## 2016-12-29 DIAGNOSIS — Z86718 Personal history of other venous thrombosis and embolism: Secondary | ICD-10-CM

## 2016-12-29 DIAGNOSIS — S32591A Other specified fracture of right pubis, initial encounter for closed fracture: Secondary | ICD-10-CM | POA: Diagnosis present

## 2016-12-29 DIAGNOSIS — Z8551 Personal history of malignant neoplasm of bladder: Secondary | ICD-10-CM

## 2016-12-29 DIAGNOSIS — Z7951 Long term (current) use of inhaled steroids: Secondary | ICD-10-CM

## 2016-12-29 DIAGNOSIS — Z66 Do not resuscitate: Secondary | ICD-10-CM

## 2016-12-29 DIAGNOSIS — S32431A Displaced fracture of anterior column [iliopubic] of right acetabulum, initial encounter for closed fracture: Secondary | ICD-10-CM | POA: Diagnosis present

## 2016-12-29 DIAGNOSIS — F419 Anxiety disorder, unspecified: Secondary | ICD-10-CM | POA: Diagnosis present

## 2016-12-29 DIAGNOSIS — N400 Enlarged prostate without lower urinary tract symptoms: Secondary | ICD-10-CM | POA: Diagnosis present

## 2016-12-29 DIAGNOSIS — M8580 Other specified disorders of bone density and structure, unspecified site: Secondary | ICD-10-CM | POA: Diagnosis present

## 2016-12-29 LAB — CBC WITH DIFFERENTIAL/PLATELET
BASOS ABS: 0.1 10*3/uL (ref 0.0–0.1)
Basophils Relative: 0 %
EOS ABS: 0 10*3/uL (ref 0.0–0.7)
EOS PCT: 0 %
HCT: 40.3 % (ref 39.0–52.0)
Hemoglobin: 13 g/dL (ref 13.0–17.0)
Lymphocytes Relative: 10 %
Lymphs Abs: 1.7 10*3/uL (ref 0.7–4.0)
MCH: 30.9 pg (ref 26.0–34.0)
MCHC: 32.3 g/dL (ref 30.0–36.0)
MCV: 95.7 fL (ref 78.0–100.0)
MONO ABS: 0.7 10*3/uL (ref 0.1–1.0)
Monocytes Relative: 4 %
Neutro Abs: 14.7 10*3/uL — ABNORMAL HIGH (ref 1.7–7.7)
Neutrophils Relative %: 86 %
PLATELETS: 282 10*3/uL (ref 150–400)
RBC: 4.21 MIL/uL — AB (ref 4.22–5.81)
RDW: 13.2 % (ref 11.5–15.5)
WBC: 17.1 10*3/uL — AB (ref 4.0–10.5)

## 2016-12-29 LAB — BASIC METABOLIC PANEL
Anion gap: 9 (ref 5–15)
BUN: 17 mg/dL (ref 6–20)
CO2: 23 mmol/L (ref 22–32)
CREATININE: 0.67 mg/dL (ref 0.61–1.24)
Calcium: 8.6 mg/dL — ABNORMAL LOW (ref 8.9–10.3)
Chloride: 108 mmol/L (ref 101–111)
GFR calc Af Amer: >60 mL/min (ref >60)
GLUCOSE: 101 mg/dL — AB (ref 65–99)
Potassium: 3.8 mmol/L (ref 3.5–5.1)
SODIUM: 140 mmol/L (ref 135–145)

## 2016-12-29 LAB — CBC
HEMATOCRIT: 40.4 % (ref 39.0–52.0)
HEMOGLOBIN: 13.2 g/dL (ref 13.0–17.0)
MCH: 30.8 pg (ref 26.0–34.0)
MCHC: 32.7 g/dL (ref 30.0–36.0)
MCV: 94.4 fL (ref 78.0–100.0)
Platelets: 271 10*3/uL (ref 150–400)
RBC: 4.28 MIL/uL (ref 4.22–5.81)
RDW: 13.1 % (ref 11.5–15.5)
WBC: 13.6 10*3/uL — AB (ref 4.0–10.5)

## 2016-12-29 LAB — URINALYSIS, ROUTINE W REFLEX MICROSCOPIC
Bilirubin Urine: NEGATIVE
Glucose, UA: NEGATIVE mg/dL
Ketones, ur: 20 mg/dL — AB
Leukocytes, UA: NEGATIVE
Nitrite: NEGATIVE
PH: 5 (ref 5.0–8.0)
Protein, ur: 30 mg/dL — AB
SPECIFIC GRAVITY, URINE: 1.021 (ref 1.005–1.030)

## 2016-12-29 MED ORDER — HYDRALAZINE HCL 20 MG/ML IJ SOLN: 10.0000 mg | INTRAMUSCULAR | Status: DC | PRN

## 2016-12-29 MED ORDER — SODIUM CHLORIDE 0.9 % IV SOLN
INTRAVENOUS | Status: DC
  Administered 2016-12-29 – 2016-12-30 (×3): via INTRAVENOUS

## 2016-12-29 MED ORDER — PANTOPRAZOLE SODIUM 40 MG PO TBEC
40.0000 mg | DELAYED_RELEASE_TABLET | Freq: Every day | ORAL | Status: DC
  Administered 2016-12-30: 40 mg via ORAL
  Filled 2016-12-29 (×2): qty 1

## 2016-12-29 MED ORDER — PANTOPRAZOLE SODIUM 40 MG IV SOLR
40.0000 mg | Freq: Every day | INTRAVENOUS | Status: DC
  Administered 2016-12-29: 40 mg via INTRAVENOUS
  Filled 2016-12-29: qty 40

## 2016-12-29 MED ORDER — ACETAMINOPHEN 325 MG PO TABS: 650.0000 mg | ORAL_TABLET | Freq: Four times a day (QID) | ORAL | Status: DC | PRN

## 2016-12-29 MED ORDER — DOCUSATE SODIUM 100 MG PO CAPS
100.0000 mg | ORAL_CAPSULE | Freq: Two times a day (BID) | ORAL | Status: DC
  Administered 2016-12-30 (×2): 100 mg via ORAL
  Filled 2016-12-29 (×3): qty 1

## 2016-12-29 MED ORDER — ONDANSETRON 4 MG PO TBDP: 4.0000 mg | ORAL_TABLET | Freq: Four times a day (QID) | ORAL | Status: DC | PRN

## 2016-12-29 MED ORDER — LORAZEPAM 2 MG/ML IJ SOLN
1.0000 mg | Freq: Once | INTRAMUSCULAR | Status: AC
  Administered 2016-12-29: 1 mg via INTRAMUSCULAR
  Filled 2016-12-29: qty 1

## 2016-12-29 MED ORDER — MORPHINE SULFATE (PF) 4 MG/ML IV SOLN
2.0000 mg | INTRAVENOUS | Status: DC | PRN
  Administered 2016-12-29 – 2016-12-31 (×2): 2 mg via INTRAVENOUS
  Filled 2016-12-29 (×2): qty 1

## 2016-12-29 MED ORDER — TRAMADOL HCL 50 MG PO TABS: 50.0000 mg | ORAL_TABLET | Freq: Four times a day (QID) | ORAL | Status: DC | PRN

## 2016-12-29 MED ORDER — ALPRAZOLAM 0.5 MG PO TABS
0.5000 mg | ORAL_TABLET | Freq: Every day | ORAL | Status: DC
  Administered 2016-12-29 – 2016-12-30 (×2): 0.5 mg via ORAL
  Filled 2016-12-29: qty 1
  Filled 2016-12-29: qty 2

## 2016-12-29 MED ORDER — ONDANSETRON HCL 4 MG/2ML IJ SOLN: 4.0000 mg | Freq: Four times a day (QID) | INTRAMUSCULAR | Status: DC | PRN

## 2016-12-29 MED ORDER — QUETIAPINE FUMARATE 50 MG PO TABS
50.0000 mg | ORAL_TABLET | Freq: Two times a day (BID) | ORAL | Status: DC
  Administered 2016-12-29 – 2017-01-01 (×6): 50 mg via ORAL
  Filled 2016-12-29 (×8): qty 1

## 2016-12-29 MED ORDER — MORPHINE SULFATE (PF) 4 MG/ML IV SOLN: 4.0000 mg | Freq: Once | INTRAVENOUS | Status: DC

## 2016-12-29 MED ORDER — ONDANSETRON HCL 4 MG/2ML IJ SOLN: 4.0000 mg | Freq: Once | INTRAMUSCULAR | Status: DC

## 2016-12-29 NOTE — ED Notes (Signed)
Pt given multiple warm blankets, both pt sons at the bedside at this time. Will continue to monitor pt. Pt left off the monitor at this time due to severe agitation. Pt IV safely removed. Hematoma noted at site.

## 2016-12-29 NOTE — ED Notes (Signed)
Attempted report x 2 

## 2016-12-29 NOTE — ED Notes (Signed)
Spoke with pt placement, pt name previously not showing up on board, order clicked off as completed but issue with performing bed placement order. Pt placement to give pt next bed available.

## 2016-12-29 NOTE — Consult Note (Signed)
CC:  Chief Complaint  Patient presents with  . Fall  . Hip Pain    HPI: [name] is a 83 y.o. male who resides in SNF with pmhx GERD, BPH, bladder cancer, dementia, T12 compression fracture who presented to ER after falling just PTA. History limited due to dementia. Son at bedside states patient is wheelchair bound at baseline and fell in bathroom today. Unknown LOC. No apparent head injury. Complains of right hip pain. Denies back pain.   PMH: Past Medical History:  Diagnosis Date  . Anxiety   . Arthritis    Hands and ankles  . Bladder cancer ([place]) 2008  . BPH (benign prostatic hyperplasia)   . GERD (gastroesophageal reflux disease)   . History of DVT (deep vein thrombosis) 2008  . Sleep apnea    Stop Bang score of 5    PSH: Past Surgical History:  Procedure Laterality Date  . BLADDER SURGERY  2008  . FEMUR IM NAIL Right 07/01/2015   Procedure: INTRAMEDULLARY (IM) NAIL FEMORAL;  Surgeon: Mcarthur Rossetti, MD;  Location: [facility];  Service: Orthopedics;  Laterality: Right;  . INGUINAL HERNIA REPAIR Left 09/05/2012   Procedure: HERNIA REPAIR INGUINAL ADULT;  Surgeon: Jamesetta So, MD;  Location: [facility];  Service: General;  Laterality: Left;  Left Inguinal Herniorraphy  . INSERTION OF MESH Left 09/05/2012   Procedure: INSERTION OF MESH;  Surgeon: Jamesetta So, MD;  Location: [facility];  Service: General;  Laterality: Left;  . PROSTATE SURGERY  2004  . TRANSURETHRAL RESECTION OF PROSTATE N/A 05/16/2013   Procedure: TRANSURETHRAL RESECTION OF THE PROSTATE (TURP);  Surgeon: Marissa Nestle, MD;  Location: [facility];  Service: Urology;  Laterality: N/A;    SH: Social History  Substance Use Topics  . Smoking status: Former Smoker    Years: 39.00    Types: Cigarettes    Quit date: 07/05/1979  . Smokeless tobacco: Not on file  . Alcohol use No    MEDS: Prior to Admission medications   Medication Sig Start Date End Date Taking? Authorizing Provider  acetaminophen  (TYLENOL) 500 MG tablet Take 500 mg by mouth 2 (two) times daily as needed for moderate pain.    Yes [provider]  ALPRAZolam Duanne Moron) 0.5 MG tablet Take 0.5 mg by mouth at bedtime.    Yes [provider]  aspirin 81 MG tablet Take 81 mg by mouth daily.   Yes [provider]  bisacodyl (DULCOLAX) 5 MG EC tablet Take 5 mg by mouth daily.    Yes [provider]  ENSURE (ENSURE) Take 237 mLs by mouth every morning.   Yes [provider]  fluticasone (FLONASE) 50 MCG/ACT nasal spray Place 1 spray into both nostrils daily as needed for allergies or rhinitis.   Yes [provider]  omeprazole (PRILOSEC) 20 MG capsule Take 20 mg by mouth daily.   Yes [provider]  QUEtiapine (SEROQUEL) 50 MG tablet Take 50 mg by mouth 2 (two) times daily.   Yes [provider]    ALLERGY: Allergies  Allergen Reactions  . Cephalosporins Other (See Comments)    unknown  . Lovenox [Enoxaparin] Other (See Comments)    Severe nausea and vomiting   . Penicillins Swelling and Other (See Comments)    Has patient had a PCN reaction causing immediate rash, facial/tongue/throat swelling, SOB or lightheadedness with hypotension: unknown Has patient had a PCN reaction causing severe rash involving mucus membranes or skin necrosis:  unknown Has patient had a PCN reaction that required hospitalization : unknown Has patient had a PCN reaction occurring within the last 10 years: unknown If all of the above answers are "NO", then may proceed with Cephalosporin use.   . Sulfa Antibiotics Hives and Rash    ROS: unable to obtain ROS  Vitals:   12/29/16 1430 12/29/16 1500  BP: 138/90 138/70  Pulse: 93 95  Resp: 20 18  Temp:     General appearance: elderly male, NAD Eyes: PERRL Musculoskeletal:     Muscle tone upper/lower extremities: Age appropriate    Motor exam: Moves all extremities, although limited due to right hip  injury Neurological Awake, not oriented to place or time CN grossly intact  IMAGING: Lumbar Spine Xray IMPRESSION: 1. New compression deformities of L1 and L4 compared to CT abdomen pelvis of 10/18/2015. 2. Diffuse osteopenia.  IMPRESSION/PLAN - 83 y.o. male with new L1 and L4 compression fractures and stable T12 compression fracture s/p fall. He also has multiple acute pelvic fractures - consulted by Ortho - non operative. He does move all extremities although limited evaluation secondary to pain. He does not complain of back pain, although it is difficult to know for sure based on his dementia which his son describes as "severe". Acute compression fractures are non operative. TLSO brace for support. Because he is non-ambulatory at baseline, son is not sure he would even seek intervention should that become necessary anyway.  I have rec he follow up outpt in 4 weeks for repeat Xrays to monitor compression fractures. Son does not believe that is possible since he lives in [place] and transportation is difficult to come by. I have rec he at least f/u with his PCP upon discharge. Son states understanding.

## 2016-12-29 NOTE — ED Notes (Signed)
When attempting to hook pt back up to the monitor, pt becomes agitated and attempts to rip off all leads and cords. This nurse removed monitoring cables from pt, will take VS periodically and leave pt of the monitor until pt more cooperative.

## 2016-12-29 NOTE — Consult Note (Addendum)
Reason for Consult:Pelvic fxs Referring Physician: D Delo  [name] is an 83 y.o. male.  HPI: [name] was at the SNF that he [facility] in when he forgot he wasn't supposed to get up by himself and fell. He had immediate right hip pain. He was brought to the ED for evaluation and was found to have a lumbar compression fx and multiple pelvic fxs with a pelvic hematoma. Orthopedic surgery was consulted. He c/o right hip pain. He is confused and not a good historian. His son is in room and helps with history.  Past Medical History:  Diagnosis Date  . Anxiety   . Arthritis    Hands and ankles  . Bladder cancer ([place]) 2008  . BPH (benign prostatic hyperplasia)   . GERD (gastroesophageal reflux disease)   . History of DVT (deep vein thrombosis) 2008  . Sleep apnea    Stop Bang score of 5    Past Surgical History:  Procedure Laterality Date  . BLADDER SURGERY  2008  . FEMUR IM NAIL Right 07/01/2015   Procedure: INTRAMEDULLARY (IM) NAIL FEMORAL;  Surgeon: Mcarthur Rossetti, MD;  Location: [facility];  Service: Orthopedics;  Laterality: Right;  . INGUINAL HERNIA REPAIR Left 09/05/2012   Procedure: HERNIA REPAIR INGUINAL ADULT;  Surgeon: Jamesetta So, MD;  Location: [facility];  Service: General;  Laterality: Left;  Left Inguinal Herniorraphy  . INSERTION OF MESH Left 09/05/2012   Procedure: INSERTION OF MESH;  Surgeon: Jamesetta So, MD;  Location: [facility];  Service: General;  Laterality: Left;  . PROSTATE SURGERY  2004  . TRANSURETHRAL RESECTION OF PROSTATE N/A 05/16/2013   Procedure: TRANSURETHRAL RESECTION OF THE PROSTATE (TURP);  Surgeon: Marissa Nestle, MD;  Location: [facility];  Service: Urology;  Laterality: N/A;    No family history on file.  Social History:  reports that he quit smoking about 37 years ago. His smoking use included Cigarettes. He quit after 39.00 years of use. He does not have any smokeless tobacco history on file. He reports that he does not drink alcohol or use  drugs.  Allergies:  Allergies  Allergen Reactions  . Cephalosporins Other (See Comments)    unknown  . Lovenox [Enoxaparin] Other (See Comments)    Severe nausea and vomiting   . Penicillins Swelling and Other (See Comments)    Has patient had a PCN reaction causing immediate rash, facial/tongue/throat swelling, SOB or lightheadedness with hypotension: unknown Has patient had a PCN reaction causing severe rash involving mucus membranes or skin necrosis: unknown Has patient had a PCN reaction that required hospitalization : unknown Has patient had a PCN reaction occurring within the last 10 years: unknown If all of the above answers are "NO", then may proceed with Cephalosporin use.   . Sulfa Antibiotics Hives and Rash    Medications: I have reviewed the patient's current medications.  Results for orders placed or performed during the hospital encounter of 12/29/16 (from the past 48 hour(s))  Basic metabolic panel     Status: Abnormal   Collection Time: 12/29/16 11:54 AM  Result Value Ref Range   Sodium 140 135 - 145 mmol/L   Potassium 3.8 3.5 - 5.1 mmol/L   Chloride 108 101 - 111 mmol/L   CO2 23 22 - 32 mmol/L   Glucose, Bld 101 (H) 65 - 99 mg/dL   BUN 17 6 - 20 mg/dL   Creatinine, Ser 0.67 0.61 - 1.24 mg/dL   Calcium 8.6 (L) 8.9 -  10.3 mg/dL   GFR calc non Af Amer >60 >60 mL/min   GFR calc Af Amer >60 >60 mL/min    Comment: (NOTE) The eGFR has been calculated using the CKD EPI equation. This calculation has not been validated in all clinical situations. eGFR's persistently <60 mL/min signify possible Chronic Kidney Disease.    Anion gap 9 5 - 15  CBC with Differential     Status: Abnormal   Collection Time: 12/29/16 11:54 AM  Result Value Ref Range   WBC 17.1 (H) 4.0 - 10.5 K/uL   RBC 4.21 (L) 4.22 - 5.81 MIL/uL   Hemoglobin 13.0 13.0 - 17.0 g/dL   HCT 40.3 39.0 - 52.0 %   MCV 95.7 78.0 - 100.0 fL   MCH 30.9 26.0 - 34.0 pg   MCHC 32.3 30.0 - 36.0 g/dL   RDW 13.2  11.5 - 15.5 %   Platelets 282 150 - 400 K/uL   Neutrophils Relative % 86 %   Neutro Abs 14.7 (H) 1.7 - 7.7 K/uL   Lymphocytes Relative 10 %   Lymphs Abs 1.7 0.7 - 4.0 K/uL   Monocytes Relative 4 %   Monocytes Absolute 0.7 0.1 - 1.0 K/uL   Eosinophils Relative 0 %   Eosinophils Absolute 0.0 0.0 - 0.7 K/uL   Basophils Relative 0 %   Basophils Absolute 0.1 0.0 - 0.1 K/uL    Dg Chest 1 View  Result Date: 12/29/2016 CLINICAL DATA:  Fall EXAM: CHEST 1 VIEW COMPARISON:  09/24/2015 chest radiograph. FINDINGS: Stable cardiomediastinal silhouette with normal heart size and aortic atherosclerosis. No pneumothorax. No pleural effusion. Lungs appear clear, with no acute consolidative airspace disease and no pulmonary edema. No displaced fractures in the chest. IMPRESSION: No active disease. Electronically Signed   By: Ilona Sorrel M.D.   On: 12/29/2016 10:36   Ct Pelvis Wo Contrast  Result Date: 12/29/2016 CLINICAL DATA:  BILATERAL pelvic pain post fall in the bathroom today EXAM: CT PELVIS WITHOUT CONTRAST TECHNIQUE: Multidetector CT imaging of the pelvis was performed following the standard protocol without intravenous contrast. COMPARISON:  RIGHT hip radiographs 12/29/2016, and CT pelvis 10/18/2015 FINDINGS: Urinary Tract: Significant prostatic enlargement, gland measuring 6.0 x 5.2 x 5.1 cm, indenting elevating bladder base. Additionally bladder is displaced RIGHT to LEFT by RIGHT pelvic sidewall hematoma which extends into the prevesical space. Bowel: Appendix not visualized. Visualized bowel loops in pelvis unremarkable Vascular/Lymphatic: Atherosclerotic calcifications aorta, iliac arteries and femoral arteries. No definite adenopathy. Reproductive:  N/A Other: RIGHT pelvic hematoma at the external margin of the obturator window, 4.9 x 2.8 x 4.2 cm. Additional RIGHT pelvic sidewall hematoma likely due to RIGHT pelvic fractures, in greatest dimensions measuring 12.2 cm AP, 6.5 cm transverse, and 8.7 cm  cranial caudal. Musculoskeletal: Marked osseous demineralization. IM nail with compression screw proximal RIGHT femur. Narrowing of BILATERAL hip joints. SI joint spaces preserved and symmetric. Multiple acute fractures: Superior endplate compression fracture L4 new since 10/18/2015. RIGHT inferior pubic ramus fracture, mildly displaced. Minimally displaced fracture at junction of RIGHT superior pubic ramus with ischium extending into anterior column of RIGHT acetabulum. Additional extension of the anterior RIGHT acetabular fracture cranially into the RIGHT iliac bone in an oblique sagittal plane. IMPRESSION: Multiple acute RIGHT pelvic fractures including at RIGHT inferior pubic ramus and at the junction of the RIGHT superior pubic ramus with the ischium extending into the anterior column and cranially in the RIGHT iliac bone. Additional superior endplate compression fracture of L4 vertebral body with  approximately 30% anterior height loss. Significant RIGHT pelvic sidewall/pelvic hematoma with displacement of the urinary bladder RIGHT to LEFT. Prostatic enlargement. Findings called to Dr. Stark Jock on 12/29/2016 at 1145 hours. Electronically Signed   By: Lavonia Dana M.D.   On: 12/29/2016 11:49   Dg Hip Unilat W Or Wo Pelvis 1 View Right  Result Date: 12/29/2016 CLINICAL DATA:  fall at [facility]. Pt states he was attempting to sit on commode chair and missed chair and fell onto bottom. Pt c/o of pain in both hips. pts right leg appears shorten and rotated outward. EXAM: DG HIP (WITH OR WITHOUT PELVIS) 1V RIGHT COMPARISON:  Pelvic film 06/30/2015 FINDINGS: In intramedullary nail fixation of of RIGHT intertrochanteric fracture. No evidence of acute fracture dislocation. No pelvic fracture sacral fracture. Overlapping shadows of the RIGHT inferior pubic ramus. This may be projectional but cannot exclude fracture. IMPRESSION: 1. No evidence of RIGHT hip fracture. 2. Indeterminate overlapping bony structures  of the RIGHT inferior pubic ramus. If Concern for fracture, recommend CT pelvis. Electronically Signed   By: Suzy Bouchard M.D.   On: 12/29/2016 10:40    Review of Systems  Constitutional: Negative for weight loss.  HENT: Negative for ear discharge, ear pain, hearing loss and tinnitus.   Eyes: Negative for blurred vision, double vision, photophobia and pain.  Respiratory: Negative for cough, sputum production and shortness of breath.   Cardiovascular: Negative for chest pain.  Gastrointestinal: Negative for abdominal pain, nausea and vomiting.  Genitourinary: Negative for dysuria, flank pain, frequency and urgency.  Musculoskeletal: Positive for joint pain (Right hip). Negative for back pain, falls, myalgias and neck pain.  Neurological: Negative for dizziness, tingling, sensory change, focal weakness, loss of consciousness and headaches.  Endo/Heme/Allergies: Does not bruise/bleed easily.  Psychiatric/Behavioral: Negative for depression, memory loss and substance abuse. The patient is not nervous/anxious.    Blood pressure 139/69, pulse 63, temperature 98.2 F (36.8 C), temperature source Oral, resp. rate 18, SpO2 98 %. Physical Exam  Constitutional: He appears well-developed and well-nourished. No distress.  HENT:  Head: Normocephalic.  Eyes: Conjunctivae are normal. Right eye exhibits no discharge. Left eye exhibits no discharge. No scleral icterus.  Cardiovascular: Normal rate and regular rhythm.   Respiratory: Effort normal. No respiratory distress.  Musculoskeletal:  Bilateral shoulder, elbow, wrist, digits- no skin wounds, nontender, no instability, no blocks to motion  Sens  Ax/R/M/U intact  Mot   Ax/ R/ PIN/ M/ AIN/ U intact  Rad 2+  RLE No traumatic wounds, ecchymosis, or rash  TTP hip  No effusions  Knee stable to varus/ valgus and anterior/posterior stress but referred pain to hip  Sens DPN, SPN, TN intact  Motor EHL, ext, flex, evers 5/5  DP 2+, PT 1+, No  significant edema   LLE No traumatic wounds, ecchymosis, or rash  Nontender  No effusions  Knee stable to varus/ valgus and anterior/posterior stress  Sens DPN, SPN, TN intact  Motor EHL, ext, flex, evers 5/5  DP 2+, PT 1+, No significant edema  Neurological: He is alert.  Skin: Skin is warm and dry. He is not diaphoretic.  Psychiatric: He has a normal mood and affect. His behavior is normal.    Assessment/Plan: Fall L4 compression fx -- NS to evaluate Right sup/inf rami fxs Right acetabulum fx -- Given his non-ambulatory status at baseline would recommend non-operative treatment for these fxs. He should remain NWB. Pelvic hematoma -- I have asked EDP to request trauma evaluation. He should  probably be monitored in the inpatient setting to make sure his hematoma stabilizes.    Lisette Abu, PA-C Orthopedic Surgery [phone] 12/29/2016, 1:08 PM   Pleasant and arousable. Eager to move when alert.  Spontaneous motion of right hip without report of pain or significant limitation. RLE No traumatic wounds, ecchymosis, or rash  No knee effusion  Motor EHL, ext, flex, evers intact  DP palp, No significant edema   Will treat nonoperatively with bed to chair transfers and NWB on the right for 8 wks.  I have seen and examined the patient. I agree with the findings above.  Rozanna Box, MD 12/29/2016 6:08 PM

## 2016-12-29 NOTE — ED Triage Notes (Signed)
Pt arrives by [facility] after having a fall at [facility]. Pt states he was attempting to sit on commode chair and missed chair and fell onto bottom. Pt c/o of pain in both hips. pts right leg appears shorten and rotated outward. Pt has equal pedal pulses with movement and sensation intact. Pt is alert and oriented to person and place.

## 2016-12-29 NOTE — ED Notes (Signed)
Pt hands placed in soft mitts. Fall mat placed on the floor by the bedside with pt curtain open and visible from the nursing station.

## 2016-12-29 NOTE — ED Notes (Signed)
Pt in radiology 

## 2016-12-29 NOTE — Consult Note (Signed)
[facility] Surgery Consult/Admission Note  [name] 1926/06/25  [id number].    Requesting MD: Dr. Stark Jock Chief Complaint/Reason for Consult: Fall, pelvic hematoma, R hip fractures  HPI:   Patient is a 83 year old male who currently resides at a SNF with a history of DVT, GERD, BPH, bladder cancer who presented to the ED after a ground-level fall that occurred PTA. Son at bedside and states patient has dementia. Son states patient was in the bathroom at the SNF when he fell on his bottom. Patient denies hitting his head or LOC. Patient is complaining of right hip pain that is constant, mild at rest, worse and severe with movement, nonradiating. No associated symptoms. Son states patient has broken his right hip in the past not sure when. Pt denies pain anywhere else.   ED course: Labs: WBC 17.1, hemoglobin 13, neutrophils 14.7 all other labs unremarkable CT scan: Multiple acute right pelvic fractures including at right inferior pubic ramus and at the junction of the right superior pubic ramus with the ischium extending into the anterior column and cranially in the right iliac bone.. Endplate compression fracture of L4 vertebral body with approximately 30% anterior height loss. Significant right pelvic sidewall/pelvic hematoma with displacement of the urinary bladder right to left.  ROS:  Review of Systems  Unable to perform ROS: Dementia     No family history on file.  Past Medical History:  Diagnosis Date  . Anxiety   . Arthritis    Hands and ankles  . Bladder cancer ([place]) 2008  . BPH (benign prostatic hyperplasia)   . GERD (gastroesophageal reflux disease)   . History of DVT (deep vein thrombosis) 2008  . Sleep apnea    Stop Bang score of 5    Past Surgical History:  Procedure Laterality Date  . BLADDER SURGERY  2008  . FEMUR IM NAIL Right 07/01/2015   Procedure: INTRAMEDULLARY (IM) NAIL FEMORAL;  Surgeon: Mcarthur Rossetti, MD;  Location: [facility];  Service:  Orthopedics;  Laterality: Right;  . INGUINAL HERNIA REPAIR Left 09/05/2012   Procedure: HERNIA REPAIR INGUINAL ADULT;  Surgeon: Jamesetta So, MD;  Location: [facility];  Service: General;  Laterality: Left;  Left Inguinal Herniorraphy  . INSERTION OF MESH Left 09/05/2012   Procedure: INSERTION OF MESH;  Surgeon: Jamesetta So, MD;  Location: [facility];  Service: General;  Laterality: Left;  . PROSTATE SURGERY  2004  . TRANSURETHRAL RESECTION OF PROSTATE N/A 05/16/2013   Procedure: TRANSURETHRAL RESECTION OF THE PROSTATE (TURP);  Surgeon: Marissa Nestle, MD;  Location: [facility];  Service: Urology;  Laterality: N/A;    Social History:  reports that he quit smoking about 37 years ago. His smoking use included Cigarettes. He quit after 39.00 years of use. He does not have any smokeless tobacco history on file. He reports that he does not drink alcohol or use drugs.  Allergies:  Allergies  Allergen Reactions  . Cephalosporins Other (See Comments)    unknown  . Lovenox [Enoxaparin] Other (See Comments)    Severe nausea and vomiting   . Penicillins Swelling and Other (See Comments)    Has patient had a PCN reaction causing immediate rash, facial/tongue/throat swelling, SOB or lightheadedness with hypotension: unknown Has patient had a PCN reaction causing severe rash involving mucus membranes or skin necrosis: unknown Has patient had a PCN reaction that required hospitalization : unknown Has patient had a PCN reaction occurring within the last 10 years: unknown If all of the  above answers are "NO", then may proceed with Cephalosporin use.   . Sulfa Antibiotics Hives and Rash     (Not in a hospital admission)  Blood pressure 139/77, pulse 93, temperature 98.2 F (36.8 C), temperature source Oral, resp. rate 14, SpO2 97 %.  Physical Exam  Constitutional:  Thin, elderly, cachectic, white male, in no acute distress  HENT:  Head: Normocephalic and atraumatic.  Right Ear: External ear normal.   Left Ear: External ear normal.  Nose: Nose normal.  Mouth/Throat: Oropharynx is clear and moist. No oropharyngeal exudate.  Eyes: Conjunctivae are normal. Right eye exhibits no discharge. Left eye exhibits no discharge. No scleral icterus.  Pupils are equal and round  Neck: Normal range of motion. Neck supple. No thyromegaly present.  Cardiovascular: Normal rate, regular rhythm, normal heart sounds and intact distal pulses.   No murmur heard. Pulses:      Radial pulses are 2+ on the right side, and 2+ on the left side.       Dorsalis pedis pulses are 2+ on the right side, and 2+ on the left side.  Pulmonary/Chest: Effort normal and breath sounds normal. No respiratory distress. He has no wheezes. He has no rhonchi. He has no rales.  Abdominal: Soft. Normal appearance and bowel sounds are normal. He exhibits no distension. There is no tenderness. No hernia.  Previous well-healed vertical incisional scar just above pubic symphysis extending to inferior umbilicus  Musculoskeletal: He exhibits deformity (right leg shorter than left ). He exhibits no edema.  No ecchymosis or deformity noted to pelvis, right leg roughly 2 inches shorter than left leg (baseline per son) sensation intact to BLE's, did not assess range of motion due to pain Normal ROM and sensation intact to BUE's Few small contusions noted to posterior right upper arm and to the left dorsal aspect of hand, no deformities or pain noted to BUEs  Neurological: He is alert. No cranial nerve deficit (grossly intact).  Skin: Skin is warm and dry.  Psychiatric: Mood and affect normal.  Nursing note and vitals reviewed.   Results for orders placed or performed during the hospital encounter of 12/29/16 (from the past 48 hour(s))  Basic metabolic panel     Status: Abnormal   Collection Time: 12/29/16 11:54 AM  Result Value Ref Range   Sodium 140 135 - 145 mmol/L   Potassium 3.8 3.5 - 5.1 mmol/L   Chloride 108 101 - 111 mmol/L   CO2 23  22 - 32 mmol/L   Glucose, Bld 101 (H) 65 - 99 mg/dL   BUN 17 6 - 20 mg/dL   Creatinine, Ser 0.67 0.61 - 1.24 mg/dL   Calcium 8.6 (L) 8.9 - 10.3 mg/dL   GFR calc non Af Amer >60 >60 mL/min   GFR calc Af Amer >60 >60 mL/min    Comment: (NOTE) The eGFR has been calculated using the CKD EPI equation. This calculation has not been validated in all clinical situations. eGFR's persistently <60 mL/min signify possible Chronic Kidney Disease.    Anion gap 9 5 - 15  CBC with Differential     Status: Abnormal   Collection Time: 12/29/16 11:54 AM  Result Value Ref Range   WBC 17.1 (H) 4.0 - 10.5 K/uL   RBC 4.21 (L) 4.22 - 5.81 MIL/uL   Hemoglobin 13.0 13.0 - 17.0 g/dL   HCT 40.3 39.0 - 52.0 %   MCV 95.7 78.0 - 100.0 fL   MCH 30.9 26.0 - 34.0 pg  MCHC 32.3 30.0 - 36.0 g/dL   RDW 13.2 11.5 - 15.5 %   Platelets 282 150 - 400 K/uL   Neutrophils Relative % 86 %   Neutro Abs 14.7 (H) 1.7 - 7.7 K/uL   Lymphocytes Relative 10 %   Lymphs Abs 1.7 0.7 - 4.0 K/uL   Monocytes Relative 4 %   Monocytes Absolute 0.7 0.1 - 1.0 K/uL   Eosinophils Relative 0 %   Eosinophils Absolute 0.0 0.0 - 0.7 K/uL   Basophils Relative 0 %   Basophils Absolute 0.1 0.0 - 0.1 K/uL   Dg Chest 1 View  Result Date: 12/29/2016 CLINICAL DATA:  Fall EXAM: CHEST 1 VIEW COMPARISON:  09/24/2015 chest radiograph. FINDINGS: Stable cardiomediastinal silhouette with normal heart size and aortic atherosclerosis. No pneumothorax. No pleural effusion. Lungs appear clear, with no acute consolidative airspace disease and no pulmonary edema. No displaced fractures in the chest. IMPRESSION: No active disease. Electronically Signed   By: Ilona Sorrel M.D.   On: 12/29/2016 10:36   Ct Pelvis Wo Contrast  Result Date: 12/29/2016 CLINICAL DATA:  BILATERAL pelvic pain post fall in the bathroom today EXAM: CT PELVIS WITHOUT CONTRAST TECHNIQUE: Multidetector CT imaging of the pelvis was performed following the standard protocol without  intravenous contrast. COMPARISON:  RIGHT hip radiographs 12/29/2016, and CT pelvis 10/18/2015 FINDINGS: Urinary Tract: Significant prostatic enlargement, gland measuring 6.0 x 5.2 x 5.1 cm, indenting elevating bladder base. Additionally bladder is displaced RIGHT to LEFT by RIGHT pelvic sidewall hematoma which extends into the prevesical space. Bowel: Appendix not visualized. Visualized bowel loops in pelvis unremarkable Vascular/Lymphatic: Atherosclerotic calcifications aorta, iliac arteries and femoral arteries. No definite adenopathy. Reproductive:  N/A Other: RIGHT pelvic hematoma at the external margin of the obturator window, 4.9 x 2.8 x 4.2 cm. Additional RIGHT pelvic sidewall hematoma likely due to RIGHT pelvic fractures, in greatest dimensions measuring 12.2 cm AP, 6.5 cm transverse, and 8.7 cm cranial caudal. Musculoskeletal: Marked osseous demineralization. IM nail with compression screw proximal RIGHT femur. Narrowing of BILATERAL hip joints. SI joint spaces preserved and symmetric. Multiple acute fractures: Superior endplate compression fracture L4 new since 10/18/2015. RIGHT inferior pubic ramus fracture, mildly displaced. Minimally displaced fracture at junction of RIGHT superior pubic ramus with ischium extending into anterior column of RIGHT acetabulum. Additional extension of the anterior RIGHT acetabular fracture cranially into the RIGHT iliac bone in an oblique sagittal plane. IMPRESSION: Multiple acute RIGHT pelvic fractures including at RIGHT inferior pubic ramus and at the junction of the RIGHT superior pubic ramus with the ischium extending into the anterior column and cranially in the RIGHT iliac bone. Additional superior endplate compression fracture of L4 vertebral body with approximately 30% anterior height loss. Significant RIGHT pelvic sidewall/pelvic hematoma with displacement of the urinary bladder RIGHT to LEFT. Prostatic enlargement. Findings called to Dr. Stark Jock on 12/29/2016 at 1145  hours. Electronically Signed   By: Lavonia Dana M.D.   On: 12/29/2016 11:49   Dg Hip Unilat W Or Wo Pelvis 1 View Right  Result Date: 12/29/2016 CLINICAL DATA:  fall at [place]. Pt states he was attempting to sit on commode chair and missed chair and fell onto bottom. Pt c/o of pain in both hips. pts right leg appears shorten and rotated outward. EXAM: DG HIP (WITH OR WITHOUT PELVIS) 1V RIGHT COMPARISON:  Pelvic film 06/30/2015 FINDINGS: In intramedullary nail fixation of of RIGHT intertrochanteric fracture. No evidence of acute fracture dislocation. No pelvic fracture sacral fracture. Overlapping shadows of  the RIGHT inferior pubic ramus. This may be projectional but cannot exclude fracture. IMPRESSION: 1. No evidence of RIGHT hip fracture. 2. Indeterminate overlapping bony structures of the RIGHT inferior pubic ramus. If Concern for fracture, recommend CT pelvis. Electronically Signed   By: Suzy Bouchard M.D.   On: 12/29/2016 10:40      Assessment/Plan  Dementia Hx of bladder cancer Hx of DVT 2008 BPH GERD Sleep apnea anxiety  Ground Level Fall L4 compression fracture - neurosurgery consult pending R pubic rami fractures extending into the iliac bone - orthopedics consulted and non-operative management  FEN: NPO until spine xrays VTE: SCD's ID: none  Plan: admit to floor, observation, hold ASA for now, q8hr CBC's to check Hg, PT  Kalman Drape, [facility] Surgery 12/29/2016, 2:30 PM Pager: [phone] Consults: [phone] Mon-Fri 7:00 am-4:30 pm Sat-Sun 7:00 am-11:30 am

## 2016-12-29 NOTE — ED Notes (Signed)
Attempted report x1. 

## 2016-12-29 NOTE — ED Notes (Signed)
Pt swallowed first ordered xanax pill without difficulty. Pt with mild cough after second xanax pill administration. Lung sounds unchanged from baseline, no crackles or gurgly voice noted. Pt O2 sat 97% on RA.

## 2016-12-29 NOTE — ED Notes (Addendum)
Family at the bedside. Pt family requesting updates and plan of care. Dr. Kae Heller called and plan clarified and discussed with family. Family verbalized understanding. Dr. Kae Heller aware of pt activity/movement on pelvis and hips when attempting to get out off bed.

## 2016-12-29 NOTE — ED Notes (Signed)
Pt family had stepped out to make a phone call. This nurse heard patient continuously calling out "HELP" from the nursing station. Upon entering pt room, pt attempting to sit up and proceeded to rip off all leads, cords, and attempting to pull out IV. Pt shouting "Don't touch me. You're going to kill me." This nurse with help of Joelene Millin, NT attempted to reorient pt. Pt combative and shouting "Don't touch me. Get me out of here!." Pt attempting to stand and get out of bed despite encouragement to remain in bed. Will alert EDP.

## 2016-12-29 NOTE — ED Notes (Signed)
Ortho tech aware of pt TLSO brace. Per Bill, ortho tech, pt will be fitted and brace placed tomorrow morning.

## 2016-12-29 NOTE — ED Notes (Signed)
Pt was able to urinate 

## 2016-12-29 NOTE — ED Notes (Signed)
Consulting Provider at bedside. 

## 2016-12-29 NOTE — ED Notes (Signed)
Admitting at the bedside.  

## 2016-12-29 NOTE — ED Notes (Signed)
Will, PA at bedside. 

## 2016-12-29 NOTE — ED Provider Notes (Signed)
[facility] DEPT Provider Note   CSN: [id number] Arrival date & time: 12/29/16  6754     History   Chief Complaint Chief Complaint  Patient presents with  . Fall  . Hip Pain    HPI [name] is a 83 y.o. male.  Patient is an 83 year old male with past medical history of bladder cancer, arthritis, DVT, prior right hip surgery. He was brought from an extended care facility for evaluation of fall. He reports falling backward onto his buttock, injuring his right hip. He denies other injury in the fall.   The history is provided by the patient.  Fall  This is a new problem. The current episode started less than 1 hour ago. The problem occurs constantly. The problem has not changed since onset.Exacerbated by: Movement and palpation. Nothing relieves the symptoms. He has tried nothing for the symptoms. The treatment provided no relief.    Past Medical History:  Diagnosis Date  . Anxiety   . Arthritis    Hands and ankles  . Bladder cancer ([place]) 2008  . BPH (benign prostatic hyperplasia)   . GERD (gastroesophageal reflux disease)   . History of DVT (deep vein thrombosis) 2008  . Sleep apnea    Stop Bang score of 5    Patient Active Problem List   Diagnosis Date Noted  . HCAP (healthcare-associated pneumonia) 09/25/2015  . Cough 09/24/2015  . Altered mental status 09/01/2015  . UTI (urinary tract infection) 08/31/2015  . Chest congestion 08/31/2015  . Closed fracture of nasal bone 08/07/2015  . BPH (benign prostatic hyperplasia) 07/01/2015  . GERD (gastroesophageal reflux disease) 07/01/2015  . Fall 07/01/2015  . Closed right hip fracture ([place]) 06/30/2015  . Bradycardia 07/02/2014  . Fatigue 07/02/2014    Past Surgical History:  Procedure Laterality Date  . BLADDER SURGERY  2008  . FEMUR IM NAIL Right 07/01/2015   Procedure: INTRAMEDULLARY (IM) NAIL FEMORAL;  Surgeon: Mcarthur Rossetti, MD;  Location: [facility];  Service: Orthopedics;  Laterality: Right;  .  INGUINAL HERNIA REPAIR Left 09/05/2012   Procedure: HERNIA REPAIR INGUINAL ADULT;  Surgeon: Jamesetta So, MD;  Location: [facility];  Service: General;  Laterality: Left;  Left Inguinal Herniorraphy  . INSERTION OF MESH Left 09/05/2012   Procedure: INSERTION OF MESH;  Surgeon: Jamesetta So, MD;  Location: [facility];  Service: General;  Laterality: Left;  . PROSTATE SURGERY  2004  . TRANSURETHRAL RESECTION OF PROSTATE N/A 05/16/2013   Procedure: TRANSURETHRAL RESECTION OF THE PROSTATE (TURP);  Surgeon: Marissa Nestle, MD;  Location: [facility];  Service: Urology;  Laterality: N/A;       Home Medications    Prior to Admission medications   Medication Sig Start Date End Date Taking? Authorizing Provider  acetaminophen (TYLENOL) 500 MG tablet Take 500 mg by mouth every 6 (six) hours as needed for pain.    [provider]  ALPRAZolam Duanne Moron) 0.25 MG tablet Take 0.25 mg by mouth 3 (three) times daily as needed for anxiety.    [provider]  aspirin 81 MG tablet Take 81 mg by mouth daily.    [provider]  bisacodyl (DULCOLAX) 5 MG EC tablet Take 5 mg by mouth daily as needed for moderate constipation.    [provider]  BISACODYL LAXATIVE PO Take 10mg  by mouth once daily as needed for constipation    [provider]  docusate sodium (COLACE) 100 MG capsule Take 100 mg by mouth 2 (two)  times daily. Hold for diarrhea    [provider]  finasteride (PROSCAR) 5 MG tablet Take 5 mg by mouth daily.    [provider]  HYDROcodone-acetaminophen (NORCO) 5-325 MG tablet Give 1 tablet by mouth every 4 hour  for mild to moderate pain. Give 2 tablets by mouth  every 4 hours for severe pain    [provider]  ipratropium (ATROVENT) 0.02 % nebulizer solution Take 0.5 mg by nebulization every 6 (six) hours as needed for wheezing or shortness of breath.    [provider]  omeprazole (PRILOSEC) 20 MG capsule Take 20 mg by mouth  daily.    [provider]  sennosides-docusate sodium (SENOKOT-S) 8.6-50 MG tablet Take 1 tablet by mouth at bedtime.    [provider]    Family History No family history on file.  Social History Social History  Substance Use Topics  . Smoking status: Former Smoker    Years: 39.00    Types: Cigarettes    Quit date: 07/05/1979  . Smokeless tobacco: Not on file  . Alcohol use No     Allergies   Cephalosporins; Lovenox [enoxaparin]; Penicillins; and Sulfa antibiotics   Review of Systems Review of Systems  All other systems reviewed and are negative.    Physical Exam Updated Vital Signs BP (!) 143/77 (BP Location: Left Arm)   Pulse 63   Temp 98.2 F (36.8 C) (Oral)   Resp (!) 24   SpO2 100%   Physical Exam  Constitutional: He is oriented to person, place, and time. He appears well-developed and well-nourished. No distress.  HENT:  Head: Normocephalic and atraumatic.  Mouth/Throat: Oropharynx is clear and moist.  Neck: Normal range of motion. Neck supple.  Cardiovascular: Normal rate and regular rhythm.  Exam reveals no friction rub.   No murmur heard. Pulmonary/Chest: Effort normal and breath sounds normal. No respiratory distress. He has no wheezes. He has no rales.  Abdominal: Soft. Bowel sounds are normal. He exhibits no distension. There is no tenderness.  Musculoskeletal: He exhibits no edema.  There is tenderness to palpation of the right lateral hip. Leg appears to be externally rotated and slightly shortened. He has pain with range of motion. DP pulses are palpable equally in both feet. Motor and sensation are intact to both feet.  Neurological: He is alert and oriented to person, place, and time. Coordination normal.  Skin: Skin is warm and dry. He is not diaphoretic.  Nursing note and vitals reviewed.    ED Treatments / Results  Labs (all labs ordered are listed, but only abnormal results are displayed) Labs Reviewed  BASIC  METABOLIC PANEL  CBC WITH DIFFERENTIAL/PLATELET    EKG  EKG Interpretation None       Radiology No results found.  Procedures Procedures (including critical care time)  Medications Ordered in ED Medications  morphine 4 MG/ML injection 4 mg (not administered)  ondansetron (ZOFRAN) injection 4 mg (not administered)     Initial Impression / Assessment and Plan / ED Course  I have reviewed the triage vital signs and the nursing notes.  Pertinent labs & imaging results that were available during my care of the patient were reviewed by me and considered in my medical decision making (see chart for details).  Patient brought by EMS for evaluation of a fall which occurred at his extended care facility. X-rays are negative for fracture, however CT scan does show multiple pelvic fractures with a significant pelvic hematoma. This was  discussed with both orthopedics and trauma and the patient will be admitted to the trauma service for observation. He also has an L4 compression fracture that appears uncomplicated.  Final Clinical Impressions(s) / ED Diagnoses   Final diagnoses:  None    New Prescriptions New Prescriptions   No medications on file     Veryl Speak, MD 12/29/16 1434

## 2016-12-30 ENCOUNTER — Observation Stay (HOSPITAL_COMMUNITY): Payer: Medicare Other

## 2016-12-30 DIAGNOSIS — N400 Enlarged prostate without lower urinary tract symptoms: Secondary | ICD-10-CM | POA: Diagnosis present

## 2016-12-30 DIAGNOSIS — Z87891 Personal history of nicotine dependence: Secondary | ICD-10-CM | POA: Diagnosis not present

## 2016-12-30 DIAGNOSIS — F419 Anxiety disorder, unspecified: Secondary | ICD-10-CM | POA: Diagnosis present

## 2016-12-30 DIAGNOSIS — Y92121 Bathroom in nursing home as the place of occurrence of the external cause: Secondary | ICD-10-CM | POA: Diagnosis not present

## 2016-12-30 DIAGNOSIS — Z66 Do not resuscitate: Secondary | ICD-10-CM | POA: Diagnosis present

## 2016-12-30 DIAGNOSIS — F0390 Unspecified dementia without behavioral disturbance: Secondary | ICD-10-CM | POA: Diagnosis present

## 2016-12-30 DIAGNOSIS — Z7982 Long term (current) use of aspirin: Secondary | ICD-10-CM | POA: Diagnosis not present

## 2016-12-30 DIAGNOSIS — Z993 Dependence on wheelchair: Secondary | ICD-10-CM | POA: Diagnosis not present

## 2016-12-30 DIAGNOSIS — R102 Pelvic and perineal pain: Secondary | ICD-10-CM | POA: Diagnosis present

## 2016-12-30 DIAGNOSIS — R52 Pain, unspecified: Secondary | ICD-10-CM | POA: Diagnosis not present

## 2016-12-30 DIAGNOSIS — W1830XA Fall on same level, unspecified, initial encounter: Secondary | ICD-10-CM | POA: Diagnosis present

## 2016-12-30 DIAGNOSIS — S32411A Displaced fracture of anterior wall of right acetabulum, initial encounter for closed fracture: Secondary | ICD-10-CM | POA: Diagnosis present

## 2016-12-30 DIAGNOSIS — K219 Gastro-esophageal reflux disease without esophagitis: Secondary | ICD-10-CM | POA: Diagnosis present

## 2016-12-30 DIAGNOSIS — S32401A Unspecified fracture of right acetabulum, initial encounter for closed fracture: Secondary | ICD-10-CM | POA: Diagnosis not present

## 2016-12-30 DIAGNOSIS — S300XXA Contusion of lower back and pelvis, initial encounter: Secondary | ICD-10-CM | POA: Diagnosis present

## 2016-12-30 DIAGNOSIS — S32591A Other specified fracture of right pubis, initial encounter for closed fracture: Secondary | ICD-10-CM | POA: Diagnosis present

## 2016-12-30 DIAGNOSIS — S32040A Wedge compression fracture of fourth lumbar vertebra, initial encounter for closed fracture: Secondary | ICD-10-CM | POA: Diagnosis not present

## 2016-12-30 DIAGNOSIS — Z515 Encounter for palliative care: Secondary | ICD-10-CM | POA: Diagnosis present

## 2016-12-30 DIAGNOSIS — Z86718 Personal history of other venous thrombosis and embolism: Secondary | ICD-10-CM | POA: Diagnosis not present

## 2016-12-30 DIAGNOSIS — Z8551 Personal history of malignant neoplasm of bladder: Secondary | ICD-10-CM | POA: Diagnosis not present

## 2016-12-30 DIAGNOSIS — Z79899 Other long term (current) drug therapy: Secondary | ICD-10-CM | POA: Diagnosis not present

## 2016-12-30 DIAGNOSIS — S32049A Unspecified fracture of fourth lumbar vertebra, initial encounter for closed fracture: Secondary | ICD-10-CM | POA: Diagnosis present

## 2016-12-30 DIAGNOSIS — S32019A Unspecified fracture of first lumbar vertebra, initial encounter for closed fracture: Secondary | ICD-10-CM | POA: Diagnosis present

## 2016-12-30 DIAGNOSIS — M8580 Other specified disorders of bone density and structure, unspecified site: Secondary | ICD-10-CM | POA: Diagnosis present

## 2016-12-30 DIAGNOSIS — G4730 Sleep apnea, unspecified: Secondary | ICD-10-CM | POA: Diagnosis present

## 2016-12-30 DIAGNOSIS — Z7951 Long term (current) use of inhaled steroids: Secondary | ICD-10-CM | POA: Diagnosis not present

## 2016-12-30 LAB — CBC
HCT: 35.7 % — ABNORMAL LOW (ref 39.0–52.0)
HEMATOCRIT: 34.7 % — AB (ref 39.0–52.0)
HEMATOCRIT: 33.7 % — AB (ref 39.0–52.0)
HEMOGLOBIN: 11.3 g/dL — AB (ref 13.0–17.0)
HEMOGLOBIN: 11.4 g/dL — AB (ref 13.0–17.0)
HEMOGLOBIN: 11.2 g/dL — AB (ref 13.0–17.0)
MCH: 30.6 pg (ref 26.0–34.0)
MCH: 30.6 pg (ref 26.0–34.0)
MCH: 31.5 pg (ref 26.0–34.0)
MCHC: 32.6 g/dL (ref 30.0–36.0)
MCHC: 31.9 g/dL (ref 30.0–36.0)
MCHC: 33.2 g/dL (ref 30.0–36.0)
MCV: 94 fL (ref 78.0–100.0)
MCV: 95.7 fL (ref 78.0–100.0)
MCV: 94.9 fL (ref 78.0–100.0)
PLATELETS: 216 10*3/uL (ref 150–400)
Platelets: 206 10*3/uL (ref 150–400)
Platelets: 203 10*3/uL (ref 150–400)
RBC: 3.69 MIL/uL — ABNORMAL LOW (ref 4.22–5.81)
RBC: 3.73 MIL/uL — AB (ref 4.22–5.81)
RBC: 3.55 MIL/uL — ABNORMAL LOW (ref 4.22–5.81)
RDW: 13.1 % (ref 11.5–15.5)
RDW: 13.6 % (ref 11.5–15.5)
RDW: 13.3 % (ref 11.5–15.5)
WBC: 9.3 10*3/uL (ref 4.0–10.5)
WBC: 8.3 10*3/uL (ref 4.0–10.5)
WBC: 8.8 10*3/uL (ref 4.0–10.5)

## 2016-12-30 LAB — URINE CULTURE

## 2016-12-30 LAB — MRSA PCR SCREENING: MRSA by PCR: POSITIVE — AB

## 2016-12-30 MED ORDER — CHLORHEXIDINE GLUCONATE CLOTH 2 % EX PADS
6.0000 | MEDICATED_PAD | Freq: Every day | CUTANEOUS | Status: DC
  Administered 2016-12-30 – 2017-01-01 (×3): 6 via TOPICAL

## 2016-12-30 MED ORDER — MUPIROCIN 2 % EX OINT
1.0000 "application " | TOPICAL_OINTMENT | Freq: Two times a day (BID) | CUTANEOUS | Status: DC
  Administered 2016-12-30 – 2017-01-01 (×5): 1 via NASAL
  Filled 2016-12-30: qty 22

## 2016-12-30 MED ORDER — ORAL CARE MOUTH RINSE
15.0000 mL | Freq: Two times a day (BID) | OROMUCOSAL | Status: DC
  Administered 2016-12-30 – 2017-01-01 (×5): 15 mL via OROMUCOSAL

## 2016-12-30 NOTE — Progress Notes (Signed)
[facility] Progress Note     Subjective: CC: hip pain, s/p fall  Pt states mild hip pain worse with movement. He denies abdominal pain, numbness, tingling or weakness. No acute events overnight. Son at bedside. Informed nurse that pt is on a pureed diet at the SNF. Spoke with son about plan and answered questions.   Objective: Vital signs in last 24 hours: Temp:  [97.9 F (36.6 C)-99.1 F (37.3 C)] 99.1 F (37.3 C) (07/11 0555) Pulse Rate:  [61-109] 84 (07/11 0555) Resp:  [11-23] 18 (07/10 2034) BP: (101-142)/(60-90) 106/60 (07/11 0555) SpO2:  [94 %-100 %] 100 % (07/11 0555) Last BM Date:  (unknown)  Intake/Output from previous day: 07/10 0701 - 07/11 0700 In: 392.5 [I.V.:392.5] Out: -  Intake/Output this shift: No intake/output data recorded.  PE: Gen:  Alert, NAD, pleasant, thin male Card:  Regular rate and rhythm, 2+ DP pulses BL Pulm:  Normal effort, clear to auscultation bilaterally Abd: Soft, non-tender, non-distended, bowel sounds present, no HSM Skin: warm and dry, no rashes  Psych: alert Extremities:  No ecchymosis or deformity noted to pelvis, right leg roughly 2 inches shorter than left leg (baseline per son) sensation intact to BLE's, did not assess range of motion due to pain but able to wiggle toes bilaterally  Lab Results:   Recent Labs  12/29/16 2006 12/30/16 0400  WBC 13.6* 9.3  HGB 13.2 11.3*  HCT 40.4 34.7*  PLT 271 206   BMET  Recent Labs  12/29/16 1154  NA 140  K 3.8  CL 108  CO2 23  GLUCOSE 101*  BUN 17  CREATININE 0.67  CALCIUM 8.6*   CMP     Component Value Date/Time   NA 140 12/29/2016 1154   K 3.8 12/29/2016 1154   CL 108 12/29/2016 1154   CO2 23 12/29/2016 1154   GLUCOSE 101 (H) 12/29/2016 1154   BUN 17 12/29/2016 1154   CREATININE 0.67 12/29/2016 1154   CALCIUM 8.6 (L) 12/29/2016 1154   PROT 6.0 (L) 09/25/2015 0715   ALBUMIN 3.2 (L) 09/25/2015 0715   AST 17 09/25/2015 0715   ALT 8 (L) 09/25/2015 0715    ALKPHOS 56 09/25/2015 0715   BILITOT 1.1 09/25/2015 0715   GFRNONAA >60 12/29/2016 1154   GFRAA >60 12/29/2016 1154     Studies/Results: Dg Chest 1 View  Result Date: 12/29/2016 CLINICAL DATA:  Fall EXAM: CHEST 1 VIEW COMPARISON:  09/24/2015 chest radiograph. FINDINGS: Stable cardiomediastinal silhouette with normal heart size and aortic atherosclerosis. No pneumothorax. No pleural effusion. Lungs appear clear, with no acute consolidative airspace disease and no pulmonary edema. No displaced fractures in the chest. IMPRESSION: No active disease. Electronically Signed   By: Ilona Sorrel M.D.   On: 12/29/2016 10:36   Dg Cervical Spine Complete  Result Date: 12/29/2016 CLINICAL DATA:  Pt fell this morning and is having severe pain mostly around hips, lower back up to mid back area. Pt in such severe pain while lying on xray table he would not hold still - best obtainable images due to patient condition EXAM: CERVICAL SPINE - COMPLETE 4+ VIEW COMPARISON:  None. FINDINGS: There is no evidence of cervical spine fracture or prevertebral soft tissue swelling. Alignment is normal. No other significant bone abnormalities are identified. Degenerative disc disease with disc height loss at C4-5, C5-6 and C6-7. Generalized osteopenia. IMPRESSION: No acute osseous injury of the cervical spine. Electronically Signed   By: Kathreen Devoid   On: 12/29/2016 15:50  Dg Thoracic Spine 2 View  Result Date: 12/29/2016 CLINICAL DATA:  [place] today with severe back pain EXAM: THORACIC SPINE 2 VIEWS COMPARISON:  Chest x-ray of 09/24/2015 FINDINGS: The bones are diffusely osteopenic. Old compression deformity of T12 is again noted with new compression of L1 vertebral body. However no additional thoracic compression deformity is seen. No prominent paravertebral soft tissue is noted. Moderate thoracic aortic atherosclerosis is present. IMPRESSION: 1. Old compression deformity of T12. No other thoracic vertebral compression  deformity is seen . 2. New compression deformity of L1. Electronically Signed   By: Ivar Drape M.D.   On: 12/29/2016 15:53   Dg Lumbar Spine 2-3 Views  Result Date: 12/29/2016 CLINICAL DATA:  [place] today, low back and hip pain EXAM: LUMBAR SPINE - 2-3 VIEW COMPARISON:  CT abdomen pelvis of 10/18/2015 FINDINGS: And old compression deformity of T12 is again noted. However there is compression deformity of L1 and L4 both of which have occurred since the CT of 10/18/2015. Compression deformity of L1 is approximately 70% with a compression deformity of L4 primarily involving the superior endplate being approximately 25%. No significant retropulsion is seen. The bones are diffusely osteopenic. IMPRESSION: 1. New compression deformities of L1 and L4 compared to CT abdomen pelvis of 10/18/2015. 2. Diffuse osteopenia. Electronically Signed   By: Ivar Drape M.D.   On: 12/29/2016 15:51   Ct Pelvis Wo Contrast  Result Date: 12/29/2016 CLINICAL DATA:  BILATERAL pelvic pain post fall in the bathroom today EXAM: CT PELVIS WITHOUT CONTRAST TECHNIQUE: Multidetector CT imaging of the pelvis was performed following the standard protocol without intravenous contrast. COMPARISON:  RIGHT hip radiographs 12/29/2016, and CT pelvis 10/18/2015 FINDINGS: Urinary Tract: Significant prostatic enlargement, gland measuring 6.0 x 5.2 x 5.1 cm, indenting elevating bladder base. Additionally bladder is displaced RIGHT to LEFT by RIGHT pelvic sidewall hematoma which extends into the prevesical space. Bowel: Appendix not visualized. Visualized bowel loops in pelvis unremarkable Vascular/Lymphatic: Atherosclerotic calcifications aorta, iliac arteries and femoral arteries. No definite adenopathy. Reproductive:  N/A Other: RIGHT pelvic hematoma at the external margin of the obturator window, 4.9 x 2.8 x 4.2 cm. Additional RIGHT pelvic sidewall hematoma likely due to RIGHT pelvic fractures, in greatest dimensions measuring 12.2 cm AP, 6.5 cm  transverse, and 8.7 cm cranial caudal. Musculoskeletal: Marked osseous demineralization. IM nail with compression screw proximal RIGHT femur. Narrowing of BILATERAL hip joints. SI joint spaces preserved and symmetric. Multiple acute fractures: Superior endplate compression fracture L4 new since 10/18/2015. RIGHT inferior pubic ramus fracture, mildly displaced. Minimally displaced fracture at junction of RIGHT superior pubic ramus with ischium extending into anterior column of RIGHT acetabulum. Additional extension of the anterior RIGHT acetabular fracture cranially into the RIGHT iliac bone in an oblique sagittal plane. IMPRESSION: Multiple acute RIGHT pelvic fractures including at RIGHT inferior pubic ramus and at the junction of the RIGHT superior pubic ramus with the ischium extending into the anterior column and cranially in the RIGHT iliac bone. Additional superior endplate compression fracture of L4 vertebral body with approximately 30% anterior height loss. Significant RIGHT pelvic sidewall/pelvic hematoma with displacement of the urinary bladder RIGHT to LEFT. Prostatic enlargement. Findings called to Dr. Stark Jock on 12/29/2016 at 1145 hours. Electronically Signed   By: Lavonia Dana M.D.   On: 12/29/2016 11:49   Dg Pelvis Comp Min 3v  Result Date: 12/30/2016 CLINICAL DATA:  Right acetabular fracture. EXAM: [facility] PELVIS - 3+ VIEW COMPARISON:  CT scan dated 12/29/2016 FINDINGS: There is a slightly displaced  fracture of the right acetabulum. The right inferior pubic ramus fracture and the extension of the fracture into the right ilium are not identified on these radiographs. There is diffuse osteopenia. Intramedullary nail and screws are noted at the right hip. IMPRESSION: Right acetabular fracture. The other right pelvic fractures identified on CT scan are not apparent on these radiographs. Electronically Signed   By: Lorriane Shire M.D.   On: 12/30/2016 09:22   Dg Hip Unilat W Or Wo Pelvis 1 View  Right  Result Date: 12/29/2016 CLINICAL DATA:  fall at [place]. Pt states he was attempting to sit on commode chair and missed chair and fell onto bottom. Pt c/o of pain in both hips. pts right leg appears shorten and rotated outward. EXAM: DG HIP (WITH OR WITHOUT PELVIS) 1V RIGHT COMPARISON:  Pelvic film 06/30/2015 FINDINGS: In intramedullary nail fixation of of RIGHT intertrochanteric fracture. No evidence of acute fracture dislocation. No pelvic fracture sacral fracture. Overlapping shadows of the RIGHT inferior pubic ramus. This may be projectional but cannot exclude fracture. IMPRESSION: 1. No evidence of RIGHT hip fracture. 2. Indeterminate overlapping bony structures of the RIGHT inferior pubic ramus. If Concern for fracture, recommend CT pelvis. Electronically Signed   By: Suzy Bouchard M.D.   On: 12/29/2016 10:40     Assessment/Plan Dementia Hx of bladder cancer Hx of DVT 2008 BPH GERD Sleep apnea anxiety  Ground Level Fall T12, L1, and L4 compression fracture - neurosurgery recommending TLSO brace and OP f/u in 4 weeks for repeat XRs - son feels like this is not possible but agreed to f/u with PCP at least  R pubic rami fractures extending into the iliac bone - orthopedics recommending PT/OT and TDWB for right LE; f/u in 14 days for repeat XRs - hip precautions - will likely need SNF  FEN: dysphagia 1 (pureed) diet VTE: SCD's ID: none  Plan: PT/OT pending and family has requested a palliative care consult   LOS: 0 days    Brigid Re , [facility] 12/30/2016, 10:47 AM Pager: [phone] Trauma Pager: [phone] Mon-Fri 7:00 am-4:30 pm Sat-Sun 7:00 am-11:30 am

## 2016-12-30 NOTE — Progress Notes (Signed)
Orthopaedic Trauma Service (OTS)      Subjective: Patient sleeping.  Discussed with son and brother yesterday the options and plan for treatment.  Objective: Current Vitals Blood pressure 106/60, pulse 84, temperature 99.1 F (37.3 C), temperature source Axillary, resp. rate 18, SpO2 100 %. Vital signs in last 24 hours: Temp:  [97.9 F (36.6 C)-99.1 F (37.3 C)] 99.1 F (37.3 C) (07/11 0555) Pulse Rate:  [61-109] 84 (07/11 0555) Resp:  [11-24] 18 (07/10 2034) BP: (101-152)/(60-90) 106/60 (07/11 0555) SpO2:  [94 %-100 %] 100 % (07/11 0555)  Intake/Output from previous day: 07/10 0701 - 07/11 0700 In: 392.5 [I.V.:392.5] Out: -   LABS  Recent Labs  12/29/16 1154 12/29/16 2006 12/30/16 0400  HGB 13.0 13.2 11.3*    Recent Labs  12/29/16 2006 12/30/16 0400  WBC 13.6* 9.3  RBC 4.28 3.69*  HCT 40.4 34.7*  PLT 271 206    Recent Labs  12/29/16 1154  NA 140  K 3.8  CL 108  CO2 23  BUN 17  CREATININE 0.67  GLUCOSE 101*  CALCIUM 8.6*   No results for input(s): LABPT, INR in the last 72 hours.   Physical Exam No change in physical examination today  Brisk cap refill, warm to touch  Assessment/Plan:    1. PT/OT TDWB right lower (will likely need SNF as previously able to [facility] for xfers before this injury); hip precautions 2. DVT proph per primary service; clear risk with dementia; mechanical may not be well tolerated either; suspect hose to be best option in this case 3. F/u 14 days with new Brunilda Payor, MD [facility] [phone] (p)

## 2016-12-30 NOTE — Progress Notes (Signed)
Ortho Tech called for pt's TLSO brace order, said somebody will call this AM.

## 2016-12-30 NOTE — Progress Notes (Signed)
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 Ortho Device/Splint Interventions: [place], [place] 12/30/2016, 4:35 PM

## 2016-12-31 DIAGNOSIS — S32401A Unspecified fracture of right acetabulum, initial encounter for closed fracture: Secondary | ICD-10-CM

## 2016-12-31 DIAGNOSIS — Z66 Do not resuscitate: Secondary | ICD-10-CM

## 2016-12-31 DIAGNOSIS — Z515 Encounter for palliative care: Secondary | ICD-10-CM

## 2016-12-31 DIAGNOSIS — R52 Pain, unspecified: Secondary | ICD-10-CM

## 2016-12-31 DIAGNOSIS — S32040A Wedge compression fracture of fourth lumbar vertebra, initial encounter for closed fracture: Secondary | ICD-10-CM

## 2016-12-31 LAB — CBC
HCT: 32.4 % — ABNORMAL LOW (ref 39.0–52.0)
HEMOGLOBIN: 10.4 g/dL — AB (ref 13.0–17.0)
MCH: 30.5 pg (ref 26.0–34.0)
MCHC: 32.1 g/dL (ref 30.0–36.0)
MCV: 95 fL (ref 78.0–100.0)
Platelets: 196 10*3/uL (ref 150–400)
RBC: 3.41 MIL/uL — AB (ref 4.22–5.81)
RDW: 13.4 % (ref 11.5–15.5)
WBC: 7.9 10*3/uL (ref 4.0–10.5)

## 2016-12-31 MED ORDER — BISACODYL 10 MG RE SUPP
10.0000 mg | Freq: Every day | RECTAL | Status: DC | PRN
  Administered 2016-12-31: 10 mg via RECTAL
  Filled 2016-12-31: qty 1

## 2016-12-31 MED ORDER — MORPHINE SULFATE (PF) 4 MG/ML IV SOLN
2.0000 mg | Freq: Four times a day (QID) | INTRAVENOUS | Status: DC
  Administered 2016-12-31 – 2017-01-01 (×4): 2 mg via INTRAVENOUS
  Filled 2016-12-31 (×3): qty 1

## 2016-12-31 MED ORDER — MORPHINE SULFATE (PF) 4 MG/ML IV SOLN
2.0000 mg | INTRAVENOUS | Status: DC | PRN
  Filled 2016-12-31 (×2): qty 1

## 2016-12-31 MED ORDER — LORAZEPAM 2 MG/ML IJ SOLN
1.0000 mg | INTRAMUSCULAR | Status: DC | PRN
  Administered 2016-12-31 – 2017-01-01 (×3): 1 mg via INTRAVENOUS
  Filled 2016-12-31 (×3): qty 1

## 2016-12-31 NOTE — Progress Notes (Signed)
PT Cancellation Note  Patient Details Name: [name] MRN: [id number] DOB: 09-04-1926   Cancelled Treatment:    Reason Eval/Treat Not Completed: Other (comment) (pt full comfort care per palliative, will sign off)   Lyncoln Maskell B Kea Callan 12/31/2016, 11:06 AM  Elwyn Reach, [place]

## 2016-12-31 NOTE — Progress Notes (Signed)
OT Cancellation Note  Patient Details Name: [name] MRN: [id number] DOB: 1926/11/12   Cancelled Treatment:    Reason Eval/Treat Not Completed: Patient not medically ready. Pt is currently on bedrest with no orders for out of bed activity. OT will continue to follow and evaluate as activity orders are progressed. Thank you!  [place] 12/31/2016, 7:50 AM  Hulda Humphrey OTR/L [phone]

## 2016-12-31 NOTE — Progress Notes (Signed)
[facility]/Trauma Progress Note      Subjective:  CC: Hip pain with movement   Pt states he feels great. He has pain if he tries to move his hip but no pain at rest. He is tolerating his diet. No other complaints. Pt is not oriented to place. Family not at bedside.   Objective: Vital signs in last 24 hours: Temp:  [98.3 F (36.8 C)-98.7 F (37.1 C)] 98.6 F (37 C) (07/12 0456) Pulse Rate:  [66-91] 66 (07/12 0456) Resp:  [16-20] 16 (07/12 0456) BP: (113-131)/(61-66) 113/61 (07/12 0456) SpO2:  [94 %-96 %] 94 % (07/12 0456) Last BM Date:  (unknown)  Intake/Output from previous day: 07/11 0701 - 07/12 0700 In: 930 [P.O.:330; I.V.:600] Out: 600 [Urine:600] Intake/Output this shift: No intake/output data recorded.  PE: Gen:  Alert, NAD, pleasant, thin male Card:  Regular rate and rhythm Pulm:  rate and effort normal, clear to auscultation bilaterally Abd: Soft, non-tender, non-distended, bowel sounds present, no HSM Skin: warm and dry, no rashes  Psych: alert Extremities:  No ecchymosis or deformity noted to right hip, mild TTP   Lab Results:   Recent Labs  12/30/16 2015 12/31/16 0430  WBC 8.8 7.9  HGB 11.2* 10.4*  HCT 33.7* 32.4*  PLT 203 196   BMET  Recent Labs  12/29/16 1154  NA 140  K 3.8  CL 108  CO2 23  GLUCOSE 101*  BUN 17  CREATININE 0.67  CALCIUM 8.6*   PT/INR No results for input(s): LABPROT, INR in the last 72 hours. CMP     Component Value Date/Time   NA 140 12/29/2016 1154   K 3.8 12/29/2016 1154   CL 108 12/29/2016 1154   CO2 23 12/29/2016 1154   GLUCOSE 101 (H) 12/29/2016 1154   BUN 17 12/29/2016 1154   CREATININE 0.67 12/29/2016 1154   CALCIUM 8.6 (L) 12/29/2016 1154   PROT 6.0 (L) 09/25/2015 0715   ALBUMIN 3.2 (L) 09/25/2015 0715   AST 17 09/25/2015 0715   ALT 8 (L) 09/25/2015 0715   ALKPHOS 56 09/25/2015 0715   BILITOT 1.1 09/25/2015 0715   GFRNONAA >60 12/29/2016 1154   GFRAA >60 12/29/2016 1154   Lipase   No results found for: LIPASE  Studies/Results: Dg Chest 1 View  Result Date: 12/29/2016 CLINICAL DATA:  Fall EXAM: CHEST 1 VIEW COMPARISON:  09/24/2015 chest radiograph. FINDINGS: Stable cardiomediastinal silhouette with normal heart size and aortic atherosclerosis. No pneumothorax. No pleural effusion. Lungs appear clear, with no acute consolidative airspace disease and no pulmonary edema. No displaced fractures in the chest. IMPRESSION: No active disease. Electronically Signed   By: Ilona Sorrel M.D.   On: 12/29/2016 10:36   Dg Cervical Spine Complete  Result Date: 12/29/2016 CLINICAL DATA:  Pt fell this morning and is having severe pain mostly around hips, lower back up to mid back area. Pt in such severe pain while lying on xray table he would not hold still - best obtainable images due to patient condition EXAM: CERVICAL SPINE - COMPLETE 4+ VIEW COMPARISON:  None. FINDINGS: There is no evidence of cervical spine fracture or prevertebral soft tissue swelling. Alignment is normal. No other significant bone abnormalities are identified. Degenerative disc disease with disc height loss at C4-5, C5-6 and C6-7. Generalized osteopenia. IMPRESSION: No acute osseous injury of the cervical spine. Electronically Signed   By: Kathreen Devoid   On: 12/29/2016 15:50   Dg Thoracic Spine 2 View  Result Date: 12/29/2016 CLINICAL  DATA:  [place] today with severe back pain EXAM: THORACIC SPINE 2 VIEWS COMPARISON:  Chest x-ray of 09/24/2015 FINDINGS: The bones are diffusely osteopenic. Old compression deformity of T12 is again noted with new compression of L1 vertebral body. However no additional thoracic compression deformity is seen. No prominent paravertebral soft tissue is noted. Moderate thoracic aortic atherosclerosis is present. IMPRESSION: 1. Old compression deformity of T12. No other thoracic vertebral compression deformity is seen . 2. New compression deformity of L1. Electronically Signed   By: Ivar Drape M.D.    On: 12/29/2016 15:53   Dg Lumbar Spine 2-3 Views  Result Date: 12/29/2016 CLINICAL DATA:  [place] today, low back and hip pain EXAM: LUMBAR SPINE - 2-3 VIEW COMPARISON:  CT abdomen pelvis of 10/18/2015 FINDINGS: And old compression deformity of T12 is again noted. However there is compression deformity of L1 and L4 both of which have occurred since the CT of 10/18/2015. Compression deformity of L1 is approximately 70% with a compression deformity of L4 primarily involving the superior endplate being approximately 25%. No significant retropulsion is seen. The bones are diffusely osteopenic. IMPRESSION: 1. New compression deformities of L1 and L4 compared to CT abdomen pelvis of 10/18/2015. 2. Diffuse osteopenia. Electronically Signed   By: Ivar Drape M.D.   On: 12/29/2016 15:51   Ct Pelvis Wo Contrast  Result Date: 12/29/2016 CLINICAL DATA:  BILATERAL pelvic pain post fall in the bathroom today EXAM: CT PELVIS WITHOUT CONTRAST TECHNIQUE: Multidetector CT imaging of the pelvis was performed following the standard protocol without intravenous contrast. COMPARISON:  RIGHT hip radiographs 12/29/2016, and CT pelvis 10/18/2015 FINDINGS: Urinary Tract: Significant prostatic enlargement, gland measuring 6.0 x 5.2 x 5.1 cm, indenting elevating bladder base. Additionally bladder is displaced RIGHT to LEFT by RIGHT pelvic sidewall hematoma which extends into the prevesical space. Bowel: Appendix not visualized. Visualized bowel loops in pelvis unremarkable Vascular/Lymphatic: Atherosclerotic calcifications aorta, iliac arteries and femoral arteries. No definite adenopathy. Reproductive:  N/A Other: RIGHT pelvic hematoma at the external margin of the obturator window, 4.9 x 2.8 x 4.2 cm. Additional RIGHT pelvic sidewall hematoma likely due to RIGHT pelvic fractures, in greatest dimensions measuring 12.2 cm AP, 6.5 cm transverse, and 8.7 cm cranial caudal. Musculoskeletal: Marked osseous demineralization. IM nail with  compression screw proximal RIGHT femur. Narrowing of BILATERAL hip joints. SI joint spaces preserved and symmetric. Multiple acute fractures: Superior endplate compression fracture L4 new since 10/18/2015. RIGHT inferior pubic ramus fracture, mildly displaced. Minimally displaced fracture at junction of RIGHT superior pubic ramus with ischium extending into anterior column of RIGHT acetabulum. Additional extension of the anterior RIGHT acetabular fracture cranially into the RIGHT iliac bone in an oblique sagittal plane. IMPRESSION: Multiple acute RIGHT pelvic fractures including at RIGHT inferior pubic ramus and at the junction of the RIGHT superior pubic ramus with the ischium extending into the anterior column and cranially in the RIGHT iliac bone. Additional superior endplate compression fracture of L4 vertebral body with approximately 30% anterior height loss. Significant RIGHT pelvic sidewall/pelvic hematoma with displacement of the urinary bladder RIGHT to LEFT. Prostatic enlargement. Findings called to Dr. Stark Jock on 12/29/2016 at 1145 hours. Electronically Signed   By: Lavonia Dana M.D.   On: 12/29/2016 11:49   Dg Pelvis Comp Min 3v  Result Date: 12/30/2016 CLINICAL DATA:  Right acetabular fracture. EXAM: JUDET PELVIS - 3+ VIEW COMPARISON:  CT scan dated 12/29/2016 FINDINGS: There is a slightly displaced fracture of the right acetabulum. The right inferior pubic ramus  fracture and the extension of the fracture into the right ilium are not identified on these radiographs. There is diffuse osteopenia. Intramedullary nail and screws are noted at the right hip. IMPRESSION: Right acetabular fracture. The other right pelvic fractures identified on CT scan are not apparent on these radiographs. Electronically Signed   By: Lorriane Shire M.D.   On: 12/30/2016 09:22   Dg Hip Unilat W Or Wo Pelvis 1 View Right  Result Date: 12/29/2016 CLINICAL DATA:  fall at [place]. Pt states he was attempting to  sit on commode chair and missed chair and fell onto bottom. Pt c/o of pain in both hips. pts right leg appears shorten and rotated outward. EXAM: DG HIP (WITH OR WITHOUT PELVIS) 1V RIGHT COMPARISON:  Pelvic film 06/30/2015 FINDINGS: In intramedullary nail fixation of of RIGHT intertrochanteric fracture. No evidence of acute fracture dislocation. No pelvic fracture sacral fracture. Overlapping shadows of the RIGHT inferior pubic ramus. This may be projectional but cannot exclude fracture. IMPRESSION: 1. No evidence of RIGHT hip fracture. 2. Indeterminate overlapping bony structures of the RIGHT inferior pubic ramus. If Concern for fracture, recommend CT pelvis. Electronically Signed   By: Suzy Bouchard M.D.   On: 12/29/2016 10:40    Anti-infectives: Anti-infectives    None       Assessment/Plan Dementia Hx of bladder cancer Hx of DVT 2008 BPH GERD Sleep apnea anxiety  Ground Level Fall T12, L1, and L4 compression fracture - neurosurgery recommending TLSO brace and OP f/u in 4 weeks for repeat XRs - son feels like this is not possible but agreed to f/u with PCP at least  R pubic rami fractures extending into the iliac bone - orthopedics recommending PT/OT and TDWB for right LE; f/u in 14 days for repeat XRs - hip precautions - will likely need SNF  FEN: dysphagia 1 (pureed) diet VTE: SCD's ID: none  Plan: PT/OT pending and family has requested a palliative care consult   LOS: 1 day    Kalman Drape , [facility] 12/31/2016, 9:27 AM Pager: [phone] Consults: [phone] Mon-Fri 7:00 am-4:30 pm Sat-Sun 7:00 am-11:30 am

## 2016-12-31 NOTE — Progress Notes (Signed)
PT Cancellation Note  Patient Details Name: [name] MRN: [id number] DOB: 05/25/1927   Cancelled Treatment:    Reason Eval/Treat Not Completed: Medical issues which prohibited therapy (pt on bedrest)   Inette Doubrava B Romano Stigger 12/31/2016, 8:18 AM  Elwyn Reach, [place]

## 2016-12-31 NOTE — Clinical Social Work Note (Signed)
Clinical Social Work Assessment  Patient Details  Name: [name] MRN: [id number] Date of Birth: Nov 05, 1926  Date of referral:  12/31/16               Reason for consult:  Trauma, Facility Placement                Permission sought to share information with:  Family Supports Permission granted to share information::  Yes, Verbal Permission Granted  Name::     Ervine Witucki  Relationship::  Son  Contact Information:  [phone]  Housing/Transportation Living arrangements for the past 2 months:  [place] Office manager) Source of Information:  Adult Children Patient Interpreter Needed:  None Criminal Activity/Legal Involvement Pertinent to Current Situation/Hospitalization:  No - Comment as needed Significant Relationships:  Adult Children, Spouse Lives with:  Facility Resident Do you feel safe going back to the place where you live?  Yes Need for family participation in patient care:  Yes (Comment)  Care giving concerns:  Patient son expressed concerns regarding patient return to [facility]F vs. Possible placement at the [facility] vs potential Hospice placement.  CSW inquired about patient family interest in [place] and patient family is very on board.   Social Worker assessment / plan:  Holiday representative met with patient sons outside of the room to offer support and discuss patient needs at discharge.  Patient sons state that patient is a current resident at [facility] which is where he sustained the fall.  Patient family states that patient has also been to the [facility] in the past so family was possibly interested.  CSW explained Medicare criteria for three night qualifying stay and patient family verbalized understanding.  Patient family is agreeable with patient return to [facility] if facility will accept back.  Patient family expressed that they feel patient is just holding on and has no quality of life at this  point.  Patient sons are interested in Hospice and are hopeful that patient could potentially be appropriate for residential hospice [facility]).  CSW provided patient sons with information regarding all possible outcomes for patient at discharge and requested Palliative Care consult from MD.  CSW to follow up with patient family following Palliative Care meeting (7/12 @ 9:30).  Employment status:  Retired Forensic scientist:  Medicare PT Recommendations:  Not assessed at this time Information / Referral to community resources:  [place], Other (Comment Required) (Possible Hospice options)  Patient/Family's Response to care:  Patient sons verbalized understanding of CSW role and appreciation for support and concern.  Patient sons definitely on onboard with pursuing more of a comfort perspective for patient at this time.  Patient/Family's Understanding of and Emotional Response to Diagnosis, Current Treatment, and Prognosis:  Patient sons are appropriately concerned and involved in patient care.  Patient sons realistic about patient quality of life and the need to pursue a comfort care approach for patient and spouse.  Emotional Assessment Appearance:  Appears stated age Attitude/Demeanor/Rapport:  Unable to Assess Affect (typically observed):  Unable to Assess Orientation:  Oriented to Self Alcohol / Substance use:  Not Applicable (Patient oriented to self only) Psych involvement (Current and /or in the community):  No (Comment)  Discharge Needs  Concerns to be addressed:  Discharge Planning Concerns, Decision making concerns Readmission within the last 30 days:  No Current discharge risk:  None Barriers to Discharge:  Continued Medical Work up  [employer], [place]

## 2016-12-31 NOTE — Progress Notes (Signed)
Chaplain attempted visit with the patient, but he had just received medication as reported by his assigned Nurse. Chaplain will follow up at a later time. Chaplain Yaakov Guthrie [phone]

## 2016-12-31 NOTE — Progress Notes (Signed)
OT Cancellation Note  Patient Details Name: KSEAN VALE MRN: [id number] DOB: [birth year]   Cancelled Treatment:    Reason Eval/Treat Not Completed: Other (comment) (pt full comfort care per palliative, will sign off)  Jaci Carrel 12/31/2016, 2:17 PM  Hulda Humphrey OTR/L [phone]

## 2016-12-31 NOTE — Clinical Social Work Note (Signed)
Clinical Social Worker did not complete SBIRT with patient due to current mental status.  Patient alert to self only - which is near baseline.  CSW continuing to follow for discharge disposition.  Barbette Or, [place]

## 2016-12-31 NOTE — Consult Note (Signed)
Consultation Note Date: 12/31/2016   Patient Name: [name]  DOB: May 10, 1927  MRN: [id number]  Age / Sex: 83 y.o., male  PCP: Redmond School, MD Referring Physician: Md, Trauma, MD  Reason for Consultation: Establishing goals of care, Non pain symptom management, Pain control and Psychosocial/spiritual support  HPI/Patient Profile: 83 y.o. male  admitted on 12/29/2016 after a fall at his facility [facility]/memory unit in [place].   He has a  history of DVT, GERD, BPH, bladder cancer and dementia.  Significant physical decline / 40 lb weight loss, functional decline/ he has been non-ambulatory for one year and cognitive decline over the past year.   In ED     Labs found: WBC 17.1, hemoglobin 13, neutrophils 14.7 all other labs unremarkable and CT scan:  Multiple acute right pelvic fractures including at right inferior pubic ramus and at the junction of the right superior pubic ramus with the ischium extending into the anterior column and cranially in the right iliac bone.. Endplate compression fracture of L4 vertebral body with approximately 30% anterior height loss. Significant right pelvic sidewall/pelvic hematoma with displacement of the urinary bladder right to left.  Family face treatment option decisions, advanced directive decisions and anticipatory care needs.  Clinical Assessment and Goals of Care:   This NP Wadie Lessen reviewed medical records, received report from team, assessed the patient and then meet at the patient's bedside along with his wife and two sons  to discuss diagnosis, prognosis, GOC, EOL wishes disposition and options.  A detailed discussion was had today regarding advanced directives.  Concepts specific to code status, artifical feeding and hydration, continued IV antibiotics and rehospitalization was had.  The difference between a aggressive medical intervention path   and a palliative comfort care path for this patient at this time was had.  Values and goals of care important to patient and family were attempted to be elicited.  MOST form introduced  Concept of Hospice and Palliative Care were discussed  Natural trajectory and expectations at EOL were discussed.  Questions and concerns addressed.   Family encouraged to call with questions or concerns.  PMT will continue to support holistically.  HCPOA/wife    SUMMARY OF RECOMMENDATIONS    -focus of care is comfort, quality and dignity, "he has been ready for a long time", "just let him be comfortable". - no life prolonging measures  Code Status/Advance Care Planning:  DNR   Symptom Management:   Pain:   Morphine 2 mg IV every 4 hrs scheduled( patient hollers out in pain on movement or repositioning)  No need for back brace, it has caused great discomfort for him  Agitation: Ativan 1 mg IV every 6 hrs prn  Palliative Prophylaxis:   Aspiration, Bowel Regimen, Frequent Pain Assessment and Oral Care  Additional Recommendations (Limitations, Scope, Preferences):  Full Comfort Care  Psycho-social/Spiritual:   Desire for further Chaplaincy support:yes  Additional Recommendations: Education on Hospice and Grief/Bereavement Support  Prognosis:   I believe with a shift to full comfort; no  artificial hydration, no antibiotics, no diagnostics, scant po intake, significant symptom management needs he will be eligible for a hospice facility.  Re-evaluate in the mroning   < 2 weeks  Discharge Planning: To Be Determined      Primary Diagnoses: Present on Admission: . Fall   I have reviewed the medical record, interviewed the patient and family, and examined the patient. The following aspects are pertinent.  Past Medical History:  Diagnosis Date  . Anxiety   . Arthritis    Hands and ankles  . Bladder cancer ([place]) 2008  . BPH (benign prostatic hyperplasia)   . GERD  (gastroesophageal reflux disease)   . History of DVT (deep vein thrombosis) 2008  . Sleep apnea    Stop Bang score of 5   Social History   Social History  . Marital status: Married    Spouse name: N/A  . Number of children: N/A  . Years of education: N/A   Social History Main Topics  . Smoking status: Former Smoker    Years: 39.00    Types: Cigarettes    Quit date: 07/05/1979  . Smokeless tobacco: None  . Alcohol use No  . Drug use: No  . Sexual activity: Not Asked   Other Topics Concern  . None   Social History Narrative  . None   No family history on file. Scheduled Meds: . Chlorhexidine Gluconate Cloth  6 each Topical Q0600  . mouth rinse  15 mL Mouth Rinse BID  .  morphine injection  2 mg Intravenous Q6H  .  morphine injection  4 mg Intravenous Once  . mupirocin ointment  1 application Nasal BID  . ondansetron (ZOFRAN) IV  4 mg Intravenous Once  . QUEtiapine  50 mg Oral BID   Continuous Infusions: . sodium chloride 75 mL/hr at 12/30/16 1115   PRN Meds:.bisacodyl, LORazepam, morphine injection, ondansetron **OR** ondansetron (ZOFRAN) IV Medications Prior to Admission:  Prior to Admission medications   Medication Sig Start Date End Date Taking? Authorizing Provider  acetaminophen (TYLENOL) 500 MG tablet Take 500 mg by mouth 2 (two) times daily as needed for moderate pain.    Yes [provider]  ALPRAZolam Duanne Moron) 0.5 MG tablet Take 0.5 mg by mouth at bedtime.    Yes [provider]  aspirin 81 MG tablet Take 81 mg by mouth daily.   Yes [provider]  bisacodyl (DULCOLAX) 5 MG EC tablet Take 5 mg by mouth daily.    Yes [provider]  ENSURE (ENSURE) Take 237 mLs by mouth every morning.   Yes [provider]  fluticasone (FLONASE) 50 MCG/ACT nasal spray Place 1 spray into both nostrils daily as needed for allergies or rhinitis.   Yes [provider]  omeprazole (PRILOSEC) 20 MG capsule Take 20 mg by mouth  daily.   Yes [provider]  QUEtiapine (SEROQUEL) 50 MG tablet Take 50 mg by mouth 2 (two) times daily.   Yes [provider]   Allergies  Allergen Reactions  . Cephalosporins Other (See Comments)    unknown  . Lovenox [Enoxaparin] Other (See Comments)    Severe nausea and vomiting   . Penicillins Swelling and Other (See Comments)    Has patient had a PCN reaction causing immediate rash, facial/tongue/throat swelling, SOB or lightheadedness with hypotension: unknown Has patient had a PCN reaction causing severe rash involving mucus membranes or skin necrosis: unknown Has patient had a PCN reaction that required hospitalization : unknown  Has patient had a PCN reaction occurring within the last 10 years: unknown If all of the above answers are "NO", then may proceed with Cephalosporin use.   . Sulfa Antibiotics Hives and Rash   Review of Systems  Unable to perform ROS: Acuity of condition    Physical Exam  Constitutional: He appears lethargic. He appears cachectic. He appears ill.  Cardiovascular: Normal rate, regular rhythm and normal heart sounds.   Pulmonary/Chest: He has decreased breath sounds in the right lower field and the left lower field.  Neurological: He appears lethargic.  -confused to time and place  Skin: Skin is warm and dry.    Vital Signs: BP 113/61 (BP Location: Left Arm)   Pulse 66   Temp 98.6 F (37 C) (Oral)   Resp 16   SpO2 94%  Pain Assessment: 0-10   Pain Score: 5    SpO2: SpO2: 94 % O2 Device:SpO2: 94 % O2 Flow Rate: .   IO: Intake/output summary:  Intake/Output Summary (Last 24 hours) at 12/31/16 1027 Last data filed at 12/31/16 0944  Gross per 24 hour  Intake              950 ml  Output              600 ml  Net              350 ml    LBM: Last BM Date:  (unknown) Baseline Weight:   Most recent weight:       Palliative Assessment/Data:  20%   Discussed with Jackson Latino PA-C and Danelle Earthly SW  Time In:  0900 Time Out: 1030 Time Total: 90  min Greater than 50%  of this time was spent counseling and coordinating care related to the above assessment and plan.  Signed by: Wadie Lessen, NP   Please contact Palliative Medicine Team phone at [phone] for questions and concerns.  For individual provider: See Shea Evans

## 2016-12-31 NOTE — Clinical Social Work Note (Signed)
Clinical Social Worker continuing to follow patient and family for support and discharge planning needs.  Patient family met with Palliative Care this morning and remain hopeful to transition patient to comfort care and pursue residential hospice at [facility].  CSW spoke with Palliative Care NP, who has made changes and will follow up with attending service MD with updates.  CSW spoke with patient son who is comfortable with decision and changes made and plans to arrive back at bedside tomorrow afternoon.  CSW remains available for support and to facilitate appropriate discharge needs once ready.  Barbette Or, [place]

## 2017-01-01 MED ORDER — BISACODYL 10 MG RE SUPP: 10.0000 mg | Freq: Every day | RECTAL | 0 refills | Status: AC | PRN

## 2017-01-01 MED ORDER — MORPHINE SULFATE (PF) 4 MG/ML IV SOLN
2.0000 mg | INTRAVENOUS | Status: DC
  Administered 2017-01-01 (×2): 2 mg via INTRAVENOUS
  Filled 2017-01-01: qty 1

## 2017-01-01 MED ORDER — ONDANSETRON 4 MG PO TBDP
4.0000 mg | ORAL_TABLET | Freq: Four times a day (QID) | ORAL | 0 refills | Status: AC | PRN
Start: 2017-01-01 — End: ?

## 2017-01-01 NOTE — Progress Notes (Signed)
[facility]/Trauma Progress Note      Subjective:  CC: Hip pain with movement   Family not at bedside. Pt mumbling, unable to discern what he is saying. Sleepy.    Objective: Vital signs in last 24 hours: Temp:  [98.6 F (37 C)-98.8 F (37.1 C)] 98.6 F (37 C) (07/13 0343) Pulse Rate:  [83-92] 83 (07/13 0343) Resp:  [14-16] 16 (07/13 0343) BP: (127-155)/(72-80) 127/72 (07/13 0343) SpO2:  [96 %-100 %] 96 % (07/13 0343) Last BM Date:  (unknown. laxative given.)  Intake/Output from previous day: 07/12 0701 - 07/13 0700 In: 20 [P.O.:20] Out: 650 [Urine:650] Intake/Output this shift: No intake/output data recorded.  PE: Gen: Alert, NAD, thin male Card: Regular rate and rhythm Pulm: rate and effort normal, CTA anteriorly Abd: Soft, non-tender, non-distended  Skin: warm and dry, no rashes    Lab Results:   Recent Labs  12/30/16 2015 12/31/16 0430  WBC 8.8 7.9  HGB 11.2* 10.4*  HCT 33.7* 32.4*  PLT 203 196   BMET  Recent Labs  12/29/16 1154  NA 140  K 3.8  CL 108  CO2 23  GLUCOSE 101*  BUN 17  CREATININE 0.67  CALCIUM 8.6*   PT/INR No results for input(s): LABPROT, INR in the last 72 hours. CMP     Component Value Date/Time   NA 140 12/29/2016 1154   K 3.8 12/29/2016 1154   CL 108 12/29/2016 1154   CO2 23 12/29/2016 1154   GLUCOSE 101 (H) 12/29/2016 1154   BUN 17 12/29/2016 1154   CREATININE 0.67 12/29/2016 1154   CALCIUM 8.6 (L) 12/29/2016 1154   PROT 6.0 (L) 09/25/2015 0715   ALBUMIN 3.2 (L) 09/25/2015 0715   AST 17 09/25/2015 0715   ALT 8 (L) 09/25/2015 0715   ALKPHOS 56 09/25/2015 0715   BILITOT 1.1 09/25/2015 0715   GFRNONAA >60 12/29/2016 1154   GFRAA >60 12/29/2016 1154   Lipase  No results found for: LIPASE  Studies/Results: Dg Pelvis Comp Min 3v  Result Date: 12/30/2016 CLINICAL DATA:  Right acetabular fracture. EXAM: JUDET PELVIS - 3+ VIEW COMPARISON:  CT scan dated 12/29/2016 FINDINGS: There is a slightly  displaced fracture of the right acetabulum. The right inferior pubic ramus fracture and the extension of the fracture into the right ilium are not identified on these radiographs. There is diffuse osteopenia. Intramedullary nail and screws are noted at the right hip. IMPRESSION: Right acetabular fracture. The other right pelvic fractures identified on CT scan are not apparent on these radiographs. Electronically Signed   By: Lorriane Shire M.D.   On: 12/30/2016 09:22    Anti-infectives: Anti-infectives    None       Assessment/Plan Dementia Hx of bladder cancer Hx of DVT 2008 BPH GERD Sleep apnea anxiety  Ground Level Fall T12, L1, and L4 compression fracture - neurosurgery recommending TLSO brace and OP f/u in 4 weeks for repeat XRs - son feels like this is not possible but agreed to f/u with PCP at least  R pubic rami fractures extending into the iliac bone - orthopedics recommending PT/OT and TDWB for right LE; f/u in 14 days for repeat XRs - hip precautions  FEN: dysphagia 1 (pureed) diet VTE: SCD's ID: none  Plan: pt has been transitioned to full comfort care and the plan is to d/c to hospice, appreciate the help of the palliative care team    LOS: 2 days    Kalman Drape , PA-C [facility] 01/01/2017, 8:11 AM Pager: [phone] Consults: [phone] Mon-Fri 7:00 am-4:30 pm Sat-Sun 7:00 am-11:30 am

## 2017-01-01 NOTE — Clinical Social Work Note (Signed)
Clinical Social Worker continuing to follow patient and family for support and discharge needs.  Patient family has decided on [facility] and is hopeful for discharge today.  Clinical Social Worker facilitated patient discharge including contacting patient family and facility to confirm patient discharge plans.  Clinical information faxed to facility and family agreeable with plan.  CSW arranged ambulance transport via [facility] to [facility].  RN to call report prior to discharge ([phone]).  Clinical Social Worker will sign off for now as social work intervention is no longer needed. Please consult [place] again if new need arises.  Barbette Or, [place]

## 2017-01-01 NOTE — Discharge Summary (Signed)
[facility]/Trauma Discharge Summary   Patient ID: [name] MRN: [id number] DOB/AGE: 1926-07-07 83 y.o.  Admit date: 12/29/2016 Discharge date: 01/01/2017  Admitting Diagnosis:  Fall R Acetabulum fracture Compression fracture of L4  Discharge Diagnosis Patient Active Problem List   Diagnosis Date Noted  . Pain, generalized   . Closed compression fracture of L4 lumbar vertebra ([place])   . Palliative care by specialist   . DNR (do not resuscitate)   . HCAP (healthcare-associated pneumonia) 09/25/2015  . Cough 09/24/2015  . Altered mental status 09/01/2015  . UTI (urinary tract infection) 08/31/2015  . Chest congestion 08/31/2015  . Closed fracture of nasal bone 08/07/2015  . BPH (benign prostatic hyperplasia) 07/01/2015  . GERD (gastroesophageal reflux disease) 07/01/2015  . Fall 07/01/2015  . Displaced fracture of anterior column (iliopubic) of right acetabulum, initial encounter for closed fracture ([place]) 06/30/2015  . Bradycardia 07/02/2014  . Fatigue 07/02/2014    Consultants Dr. Marcelino Scot, orthopedics Dr. Kathyrn Sheriff, neurosurgery Dr. Rowe Pavy, palliative care  Imaging: No results found.  Procedures None  HPI: Patient is a 83 year old male who currently resides at a SNF with a history of DVT, GERD, BPH, bladder cancer who presented to the ED after a ground-level fall that occurred PTA. Son at bedside and states patient has dementia. Son states patient was in the bathroom at the SNF when he fell on his bottom. Patient denies hitting his head or LOC. Patient is complaining of right hip pain that is constant, mild at rest, worse and severe with movement, nonradiating. No associated symptoms. Son states patient has broken his right hip in the past not sure when. Pt denies pain anywhere else.  Hospital Course:  Workup showed right acetabulum fracture and L4 compression fracture . Neurosurgery was consulted who recommended TLSO brace when out of bed. Orthopedics  was consulted and recommended nonoperative management since patient was not ambulatory at baseline. They recommended touch down weightbearing and anterior hip precautions. Patient was admitted to the floor. Family requested palliative care consult. After meeting with palliative care it was decided to transition the patient to full comfort care. On 07/13, Patient was stable for discharge to hospice facility.    Allergies as of 01/01/2017      Reactions   Cephalosporins Other (See Comments)   unknown   Lovenox [enoxaparin] Other (See Comments)   Severe nausea and vomiting    Penicillins Swelling, Other (See Comments)   Has patient had a PCN reaction causing immediate rash, facial/tongue/throat swelling, SOB or lightheadedness with hypotension: unknown Has patient had a PCN reaction causing severe rash involving mucus membranes or skin necrosis: unknown Has patient had a PCN reaction that required hospitalization : unknown Has patient had a PCN reaction occurring within the last 10 years: unknown If all of the above answers are "NO", then may proceed with Cephalosporin use.   Sulfa Antibiotics Hives, Rash      Medication List    TAKE these medications   acetaminophen 500 MG tablet Commonly known as:  TYLENOL Take 500 mg by mouth 2 (two) times daily as needed for moderate pain.   aspirin 81 MG tablet Take 81 mg by mouth daily.   bisacodyl 5 MG EC tablet Commonly known as:  DULCOLAX Take 5 mg by mouth daily. What changed:  Another medication with the same name was added. Make sure you understand how and when to take each.   bisacodyl 10 MG suppository Commonly known as:  DULCOLAX Place 1 suppository (10 mg  total) rectally daily as needed for mild constipation. What changed:  You were already taking a medication with the same name, and this prescription was added. Make sure you understand how and when to take each.   ENSURE Take 237 mLs by mouth every morning.   fluticasone 50  MCG/ACT nasal spray Commonly known as:  FLONASE Place 1 spray into both nostrils daily as needed for allergies or rhinitis.   omeprazole 20 MG capsule Commonly known as:  PRILOSEC Take 20 mg by mouth daily.   ondansetron 4 MG disintegrating tablet Commonly known as:  ZOFRAN-ODT Take 1 tablet (4 mg total) by mouth every 6 (six) hours as needed for nausea.   QUEtiapine 50 MG tablet Commonly known as:  SEROQUEL Take 50 mg by mouth 2 (two) times daily.   XANAX 0.5 MG tablet Generic drug:  ALPRAZolam Take 0.5 mg by mouth at bedtime.        Follow-up Information    Altamese [place], MD. Schedule an appointment as soon as possible for a visit in 3 week(s).   Specialty:  Orthopedic Surgery Why:  follow up for R acetabulum fracture  Contact information: [address] [place] [address] [phone]        [place]. Call.   Why:  as needed Contact information: [address] [address] [phone]          Signed: [facility] 01/01/2017, 10:45 AM Pager: [phone] Consults: [phone] Mon-Fri 7:00 am-4:30 pm Sat-Sun 7:00 am-11:30 am

## 2017-01-01 NOTE — Care Management Note (Signed)
Case Management Note  Patient Details  Name: [name] MRN: [id number] Date of Birth: [birth year]  Subjective/Objective:   Pt admitted on 12/29/16 s/p ground level fall with T12, L1 and L4 compression fracture, Rt pubic rami fractures extending into the iliac bone.  PTA, pt resided at [facility] in [place].                   Action/Plan: Family/patient wish to transition to full comfort care/nonoperative management after goals of care meeting with Palliative Medicine Team.  They desire transfer to residential hospice facility in [place], [place].    Expected Discharge Date:  01/01/17               Expected Discharge Plan:  [place]  In-House Referral:  Clinical Social Work  Discharge planning Services  CM Consult  Post Acute Care Choice:    Choice offered to:     DME Arranged:    DME Agency:     HH Arranged:    [place] Agency:     Status of Service:  Completed, signed off  If discussed at [employer], dates discussed:    Additional Comments:  01/01/17 J. Glynn Yepes, Therapist, sports, BSN Plan discharge today to [facility] in [place], [place], per [place] arrangements.    Reinaldo Raddle, RN, BSN  Trauma/Neuro ICU Case Manager [phone]

## 2017-01-01 NOTE — Progress Notes (Signed)
Patient ID: [name], male   DOB: [birth year], 83 y.o.   MRN: [id number]  Spoke to nursing by telephone.  Patient continues to decline.  Taking only ice chips, little interaction.  Hollers out on repositioning or movement.   Will increase interval for Morphine to every 4 hrs scheduled.  Family is hopeful for hospice facility for EOL care, will write for choice.  They are requesting [facility].   Prognosis is days to weeks.  No charge  Wadie Lessen NP  Palliative Medicine Team Team Phone # [phone] Pager [phone]

## 2017-01-01 NOTE — Progress Notes (Signed)
Wasted morphine 2 mg iv in sink. Administered 2 mg. Iv prior to discharge.

## 2017-01-01 NOTE — Progress Notes (Signed)
Orthopedic Trauma Service Progress Note    Subjective:  No family in room  Incoherent speech Minimal speech Sleepy   ROS As above  Objective:   VITALS:   Vitals:   12/31/16 0100 12/31/16 0456 12/31/16 1400 01/01/17 0343  BP: 122/65 113/61 (!) 155/80 127/72  Pulse: 90 66 92 83  Resp: 18 16 14 16   Temp: 98.7 F (37.1 C) 98.6 F (37 C) 98.8 F (37.1 C) 98.6 F (37 C)  TempSrc: Oral Oral Oral Oral  SpO2: 94% 94% 100% 96%    Intake/Output      07/12 0701 - 07/13 0700 07/13 0701 - 07/14 0700   P.O. 20    Total Intake 20     Urine 650    Total Output 650     Net -630          Stool Occurrence 1 x      LABS  No results found for this or any previous visit (from the past 24 hour(s)).   PHYSICAL EXAM:    Gen: sleepy  Pelvis: soft tissue around R hip stable           Pain with gentle manipulation of R hip            Moving toes and ankle actively            No significant swelling distally            + DP pulse             Assessment/Plan:     Active Problems:   Displaced fracture of anterior column (iliopubic) of right acetabulum, initial encounter for closed fracture ([place])   Fall   Pain, generalized   Closed compression fracture of L4 lumbar vertebra ([place])   Palliative care by specialist   DNR (do not resuscitate)   Anti-infectives    None    .  POD/HD#: 2  Fall   - R anterior column, anterior wall acetabulum fx:  Non-op  TDWB  Anterior hip precautions   PT/OT   Pt being transition to full comfort care with dc to hospice   Can follow up with ortho in 3-4 weeks    [phone]   - T12, L1 and L4 fx  Per NS   - Pain management:  Per palliative care  - DVT/PE prophylaxis:  Per primary team   - Dispo:  Non-op R acetabulum fx  dispo per palliative care/Trauma service      Jari Pigg, PA-C [facility] [phone] (P) [phone] (O) 01/01/2017, 9:53 AM

## 2017-01-01 NOTE — Progress Notes (Signed)
Attempted to call report to [facility] but was unsuccessful. Patient transported via [place] to [place].

## 2017-01-20 DEATH — deceased

## 2017-09-09 IMAGING — CT CT CERVICAL SPINE W/O CM
4 of 5 series · 14 of 33 positions shown, 16 images · non-contrast
Comparison: None.

CLINICAL DATA: Patient fell getting out of bed. Laceration over the
forehead. Blood in the nose

EXAM:
CT HEAD WITHOUT CONTRAST
CT CERVICAL SPINE WITHOUT CONTRAST
TECHNIQUE: Multidetector CT imaging of the head and cervical spine was
performed following the standard protocol without intravenous
contrast. Multiplanar CT image reconstructions of the cervical spine
were also generated.

[Series 5: cervical st 2.0 b31s · axial · 0.41mm/px · z∈[+121,+159]mm · 2 of 75 slices shown]
[im 19/75  bone]
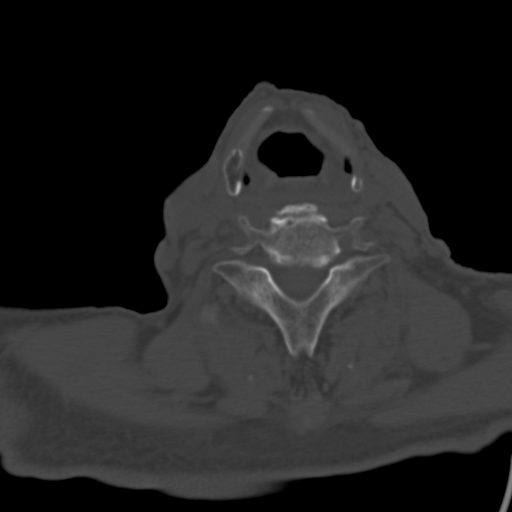
[im 38/75  bone]
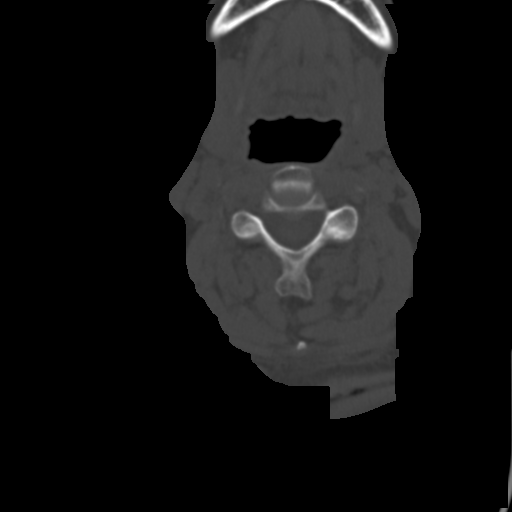

[Series 7: sagittal bone 2.0 · sagittal · 0.24mm/px · 5 of 60 slices shown, 6 images]
[im 20/60  bone]
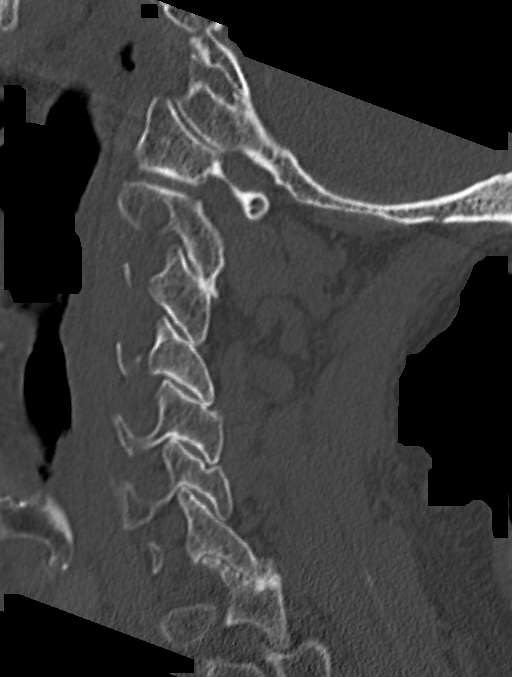
[im 25/60  bone]
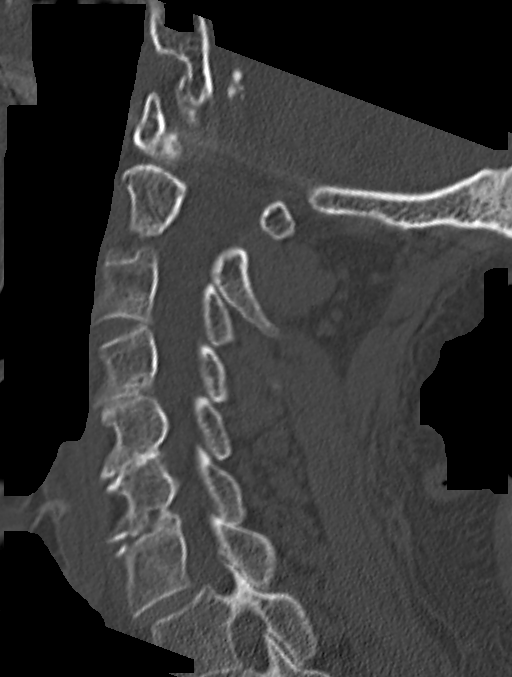
[im 30/60  soft-tissue]
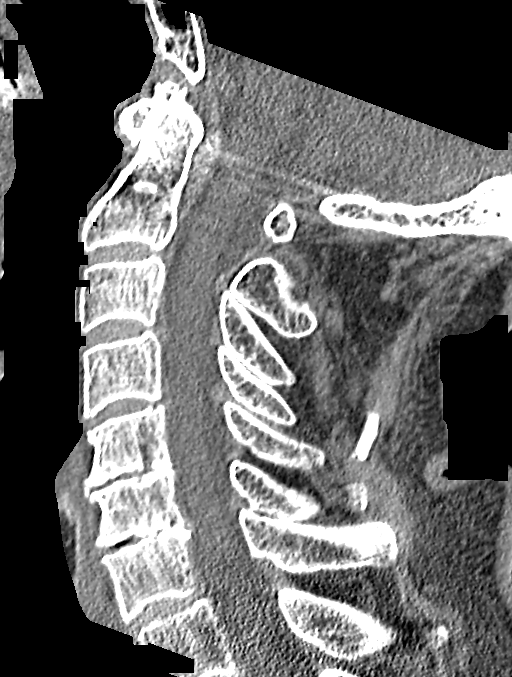
[im 30/60  bone]
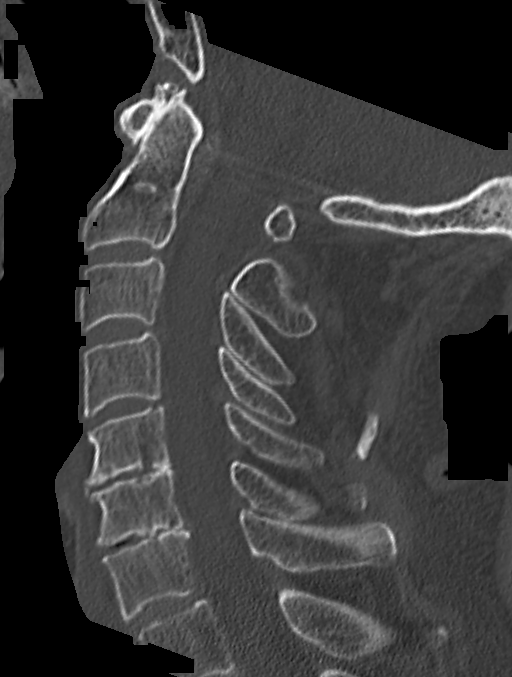
[im 35/60  bone]
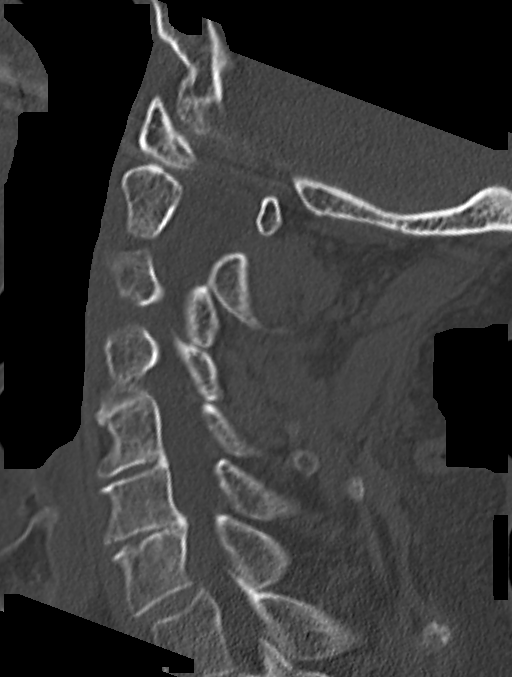
[im 40/60  bone]
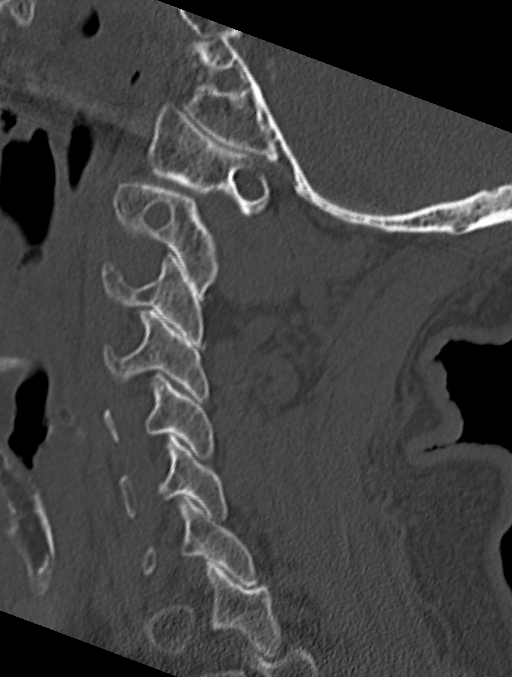

[Series 8: coronal bone 2.0 · coronal · 0.31mm/px · 3 of 51 slices shown]
[im 11/51  bone]
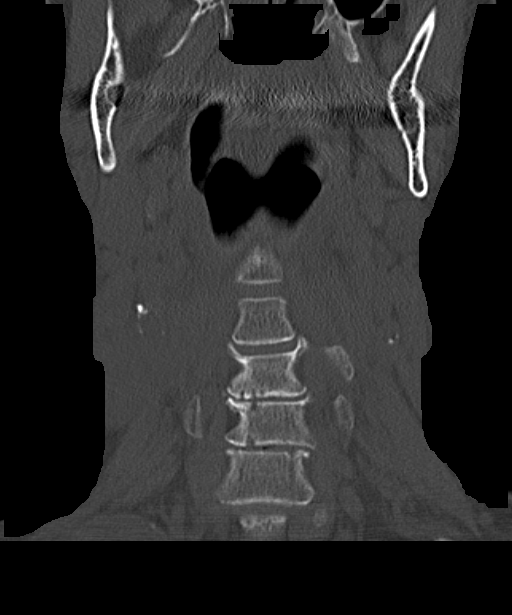
[im 21/51  bone]
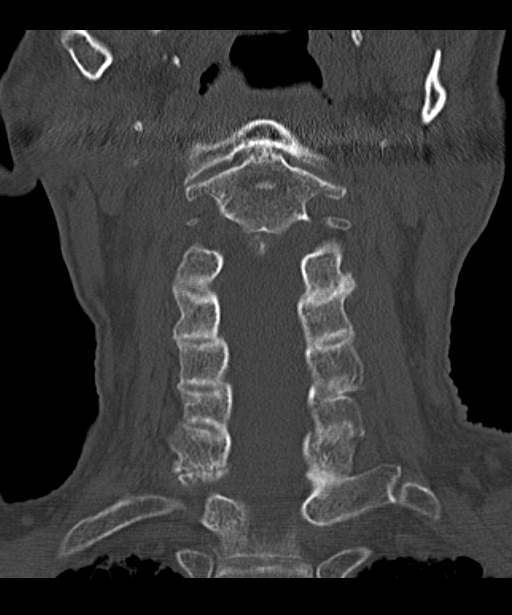
[im 30/51  bone]
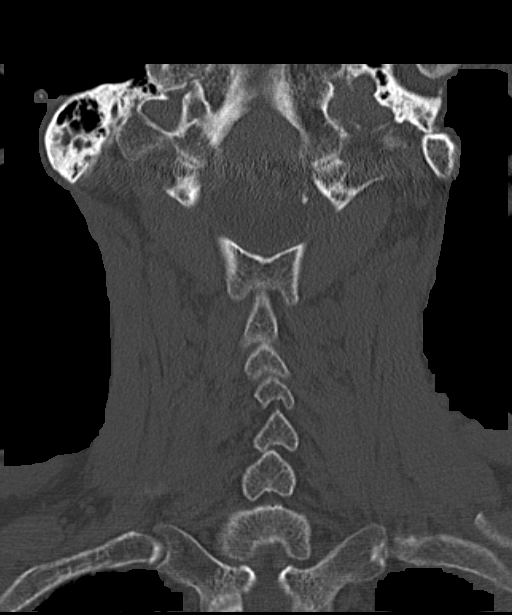

[Series 9: axial bone 2.0 · axial · 0.21mm/px · z∈[+88,+177]mm · 4 of 80 slices shown, 5 images]
[im 16/80  soft-tissue]
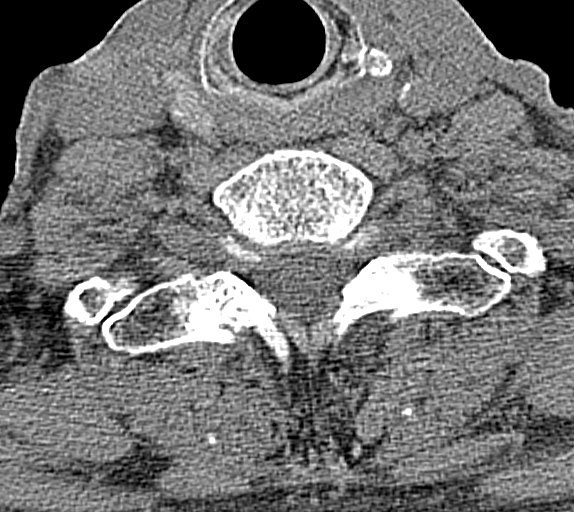
[im 16/80  bone]
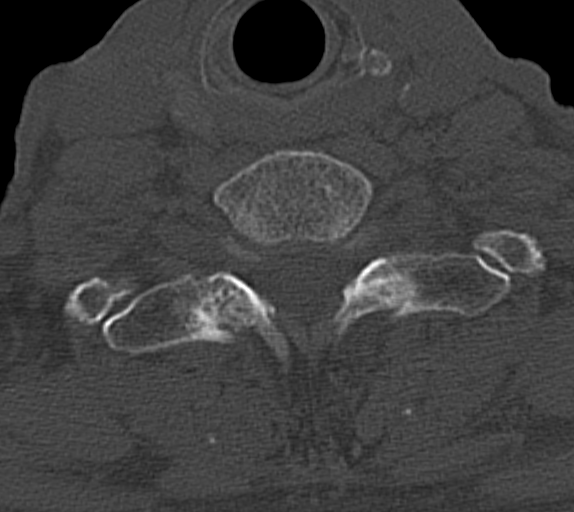
[im 32/80  bone]
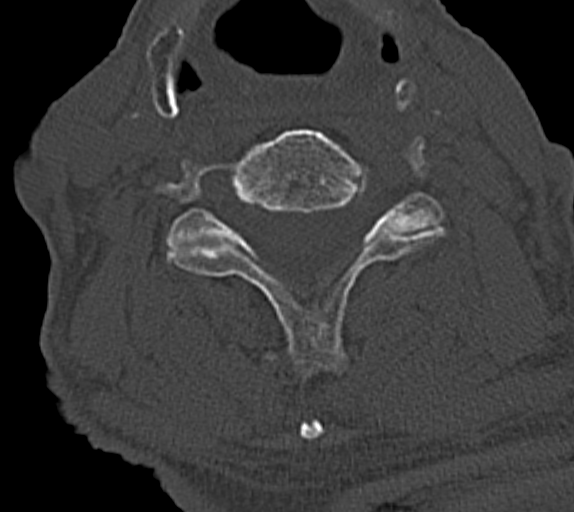
[im 48/80  bone]
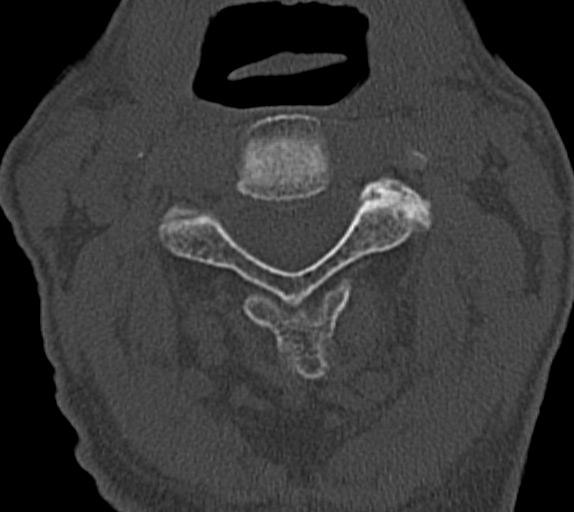
[im 64/80  bone]
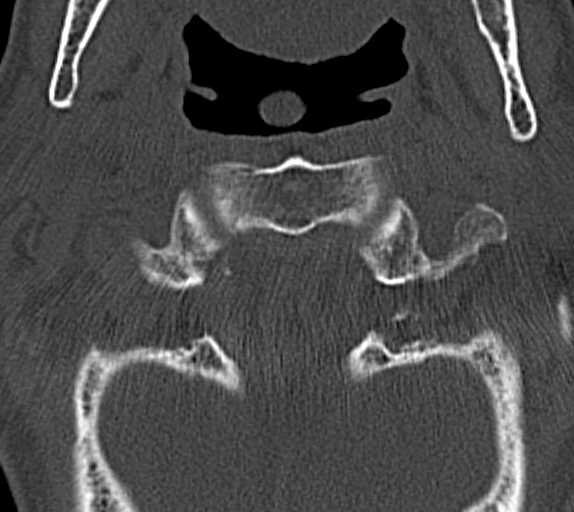

[14 of 33 positions shown; findings below may reference images not displayed]

FINDINGS: CT HEAD FINDINGS

Small subcutaneous scalp hematoma over the right anterior frontal
region. Diffuse cerebral atrophy. Ventricular dilatation consistent
with central atrophy. Low-attenuation changes in the deep white
matter consistent with small vessel ischemia. No mass effect or
midline shift. No abnormal extra-axial fluid collections. Gray-white
matter junctions are distinct. Basal cisterns are not effaced. No
evidence of acute intracranial hemorrhage. No depressed skull
fractures. Opacification of some of the ethmoid air cells.
Opacification of parts of the right mastoid air cells. Acute
appearing nasal bone fractures with mild depression.

CT CERVICAL SPINE FINDINGS

Normal alignment of the cervical spine. Degenerative changes with
narrowed interspaces and endplate hypertrophic changes most
prominent at C4-5, C5-6, and C6-7 levels. No vertebral compression
deformities. No prevertebral soft tissue swelling. C1-2 articulation
appears intact. Degenerative changes throughout the facet joints. No
focal bone lesion or bone destruction. Vascular calcifications.
Prominent emphysematous changes and fibrosis in the lung apices.
IMPRESSION: No acute intracranial abnormalities. Subcutaneous scalp hematoma
over the right anterior frontal region. Acute appearing nasal bone
fractures.

Normal alignment of the cervical spine. Degenerative changes. No
acute displaced fractures identified.

## 2017-09-27 IMAGING — DX DG CHEST 2V
2 series · 2 of 2 positions shown · non-contrast
Comparison: 02/01/2014, 02/28/2007

CLINICAL DATA: Fever and cough

EXAM:
CHEST  2 VIEW

[chest lat]
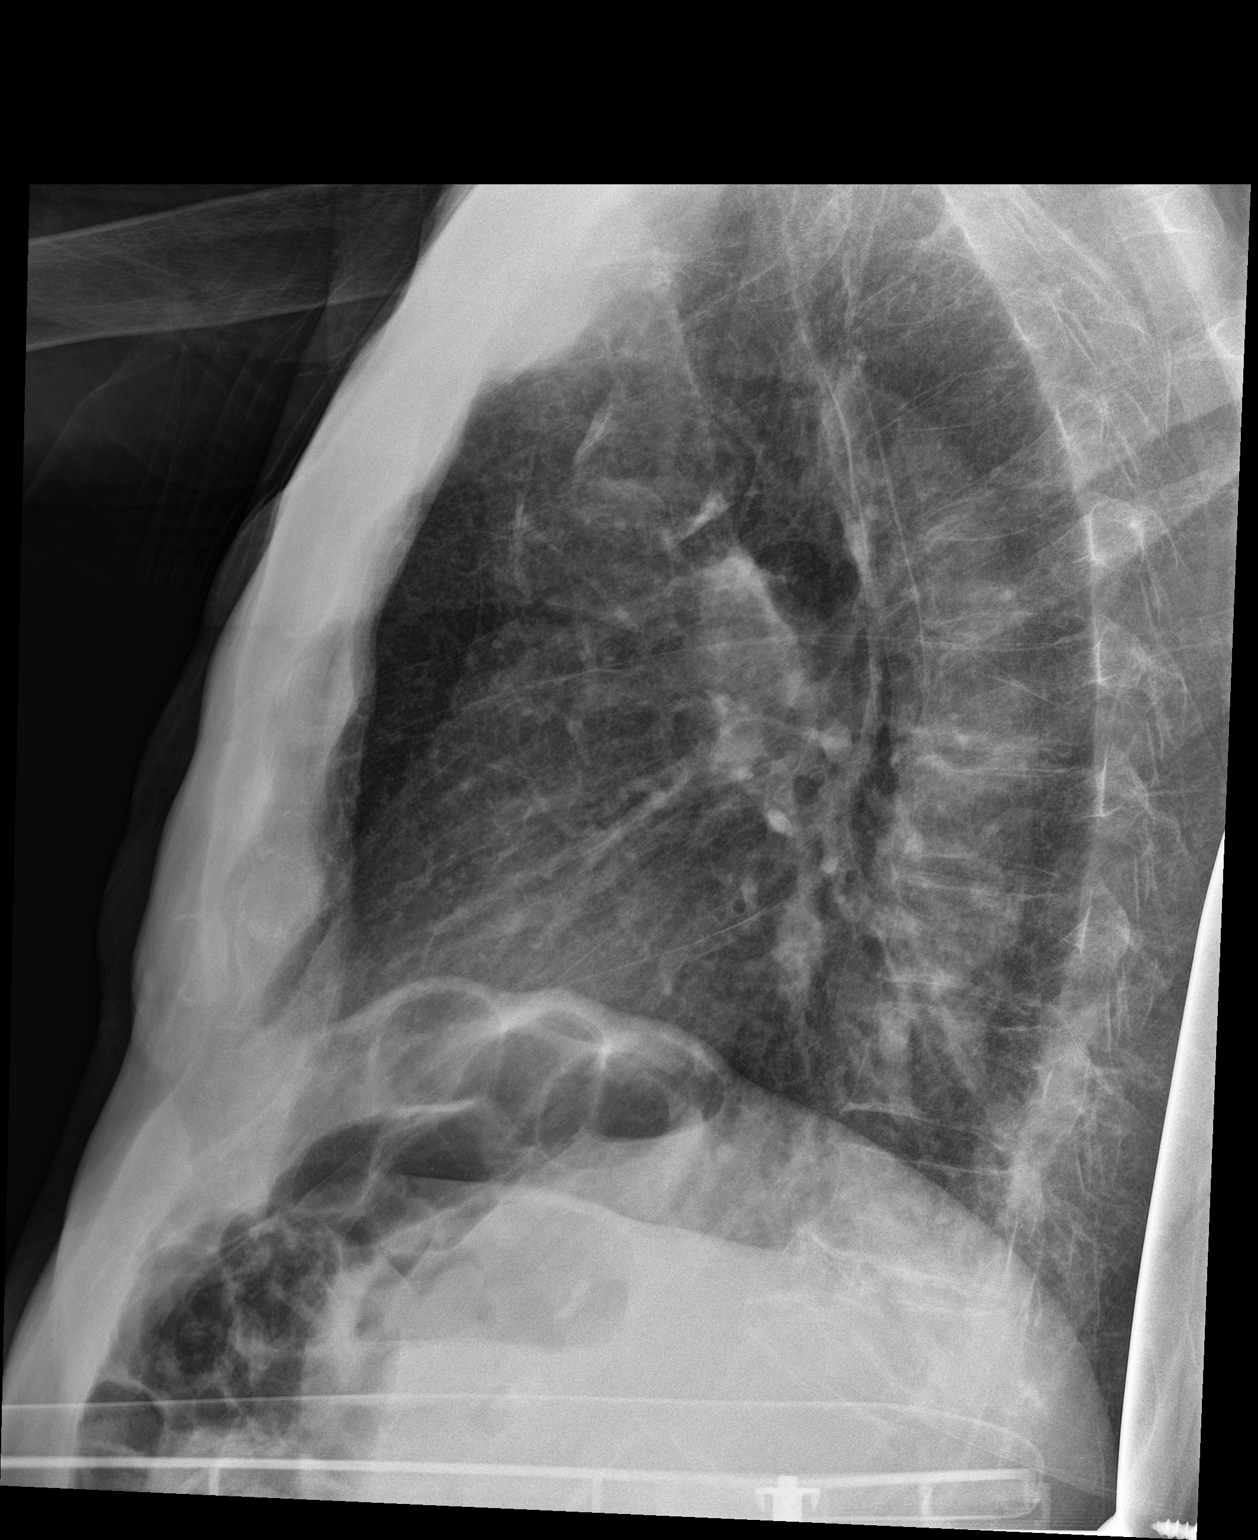

[chest ap]
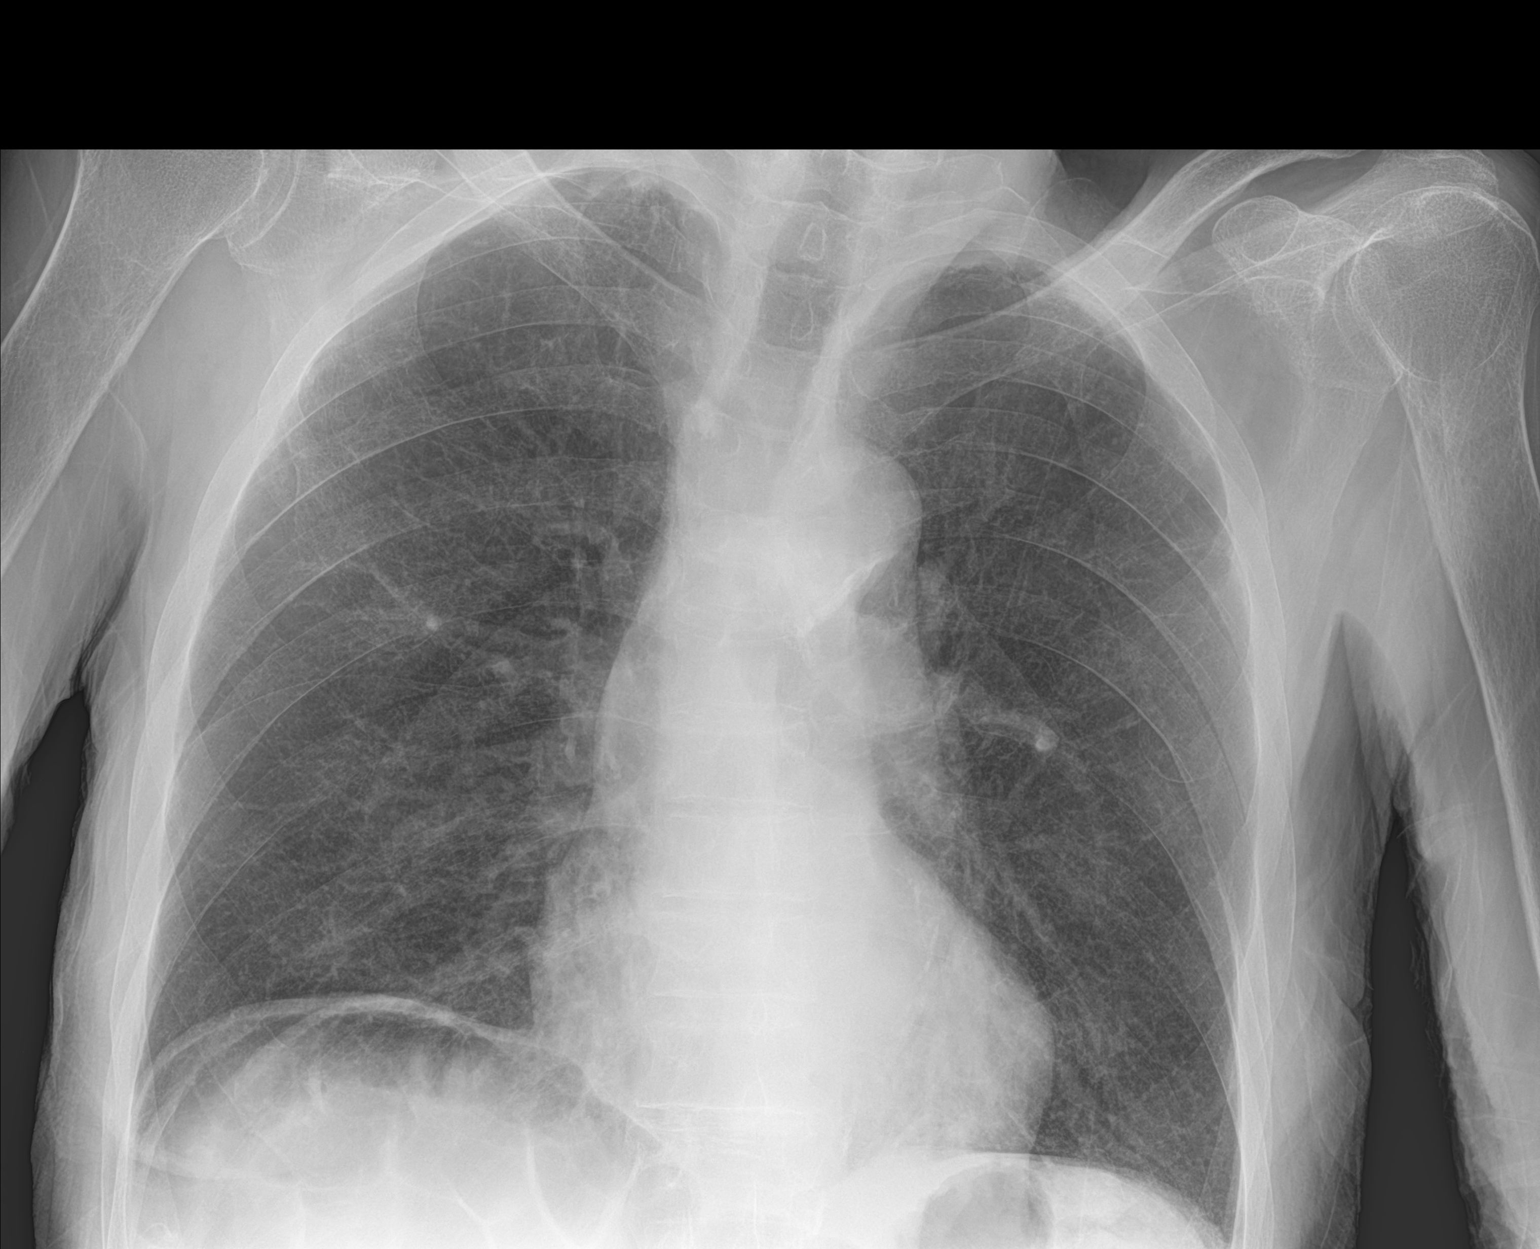

[2 of 2 positions shown; findings below may reference images not displayed]

FINDINGS: Heart size and vascular pattern are normal. There is hyperinflation
consistent with COPD. Mild diffuse interstitial prominence likely
chronic, similar to prior studies allowing for different
radiographic technique. No focal infiltrate or consolidation.
Interposition of colon beneath the right diaphragm noted. Severe
thoracolumbar compression deformity unchanged from 1779.
IMPRESSION: No acute cardiopulmonary abnormality.

## 2017-10-27 IMAGING — DX DG CHEST 2V
2 series · 2 of 2 positions shown · non-contrast
Comparison: 08/25/2015.

CLINICAL DATA: Cough and congestion.

EXAM:
CHEST  2 VIEW

[chest lat]
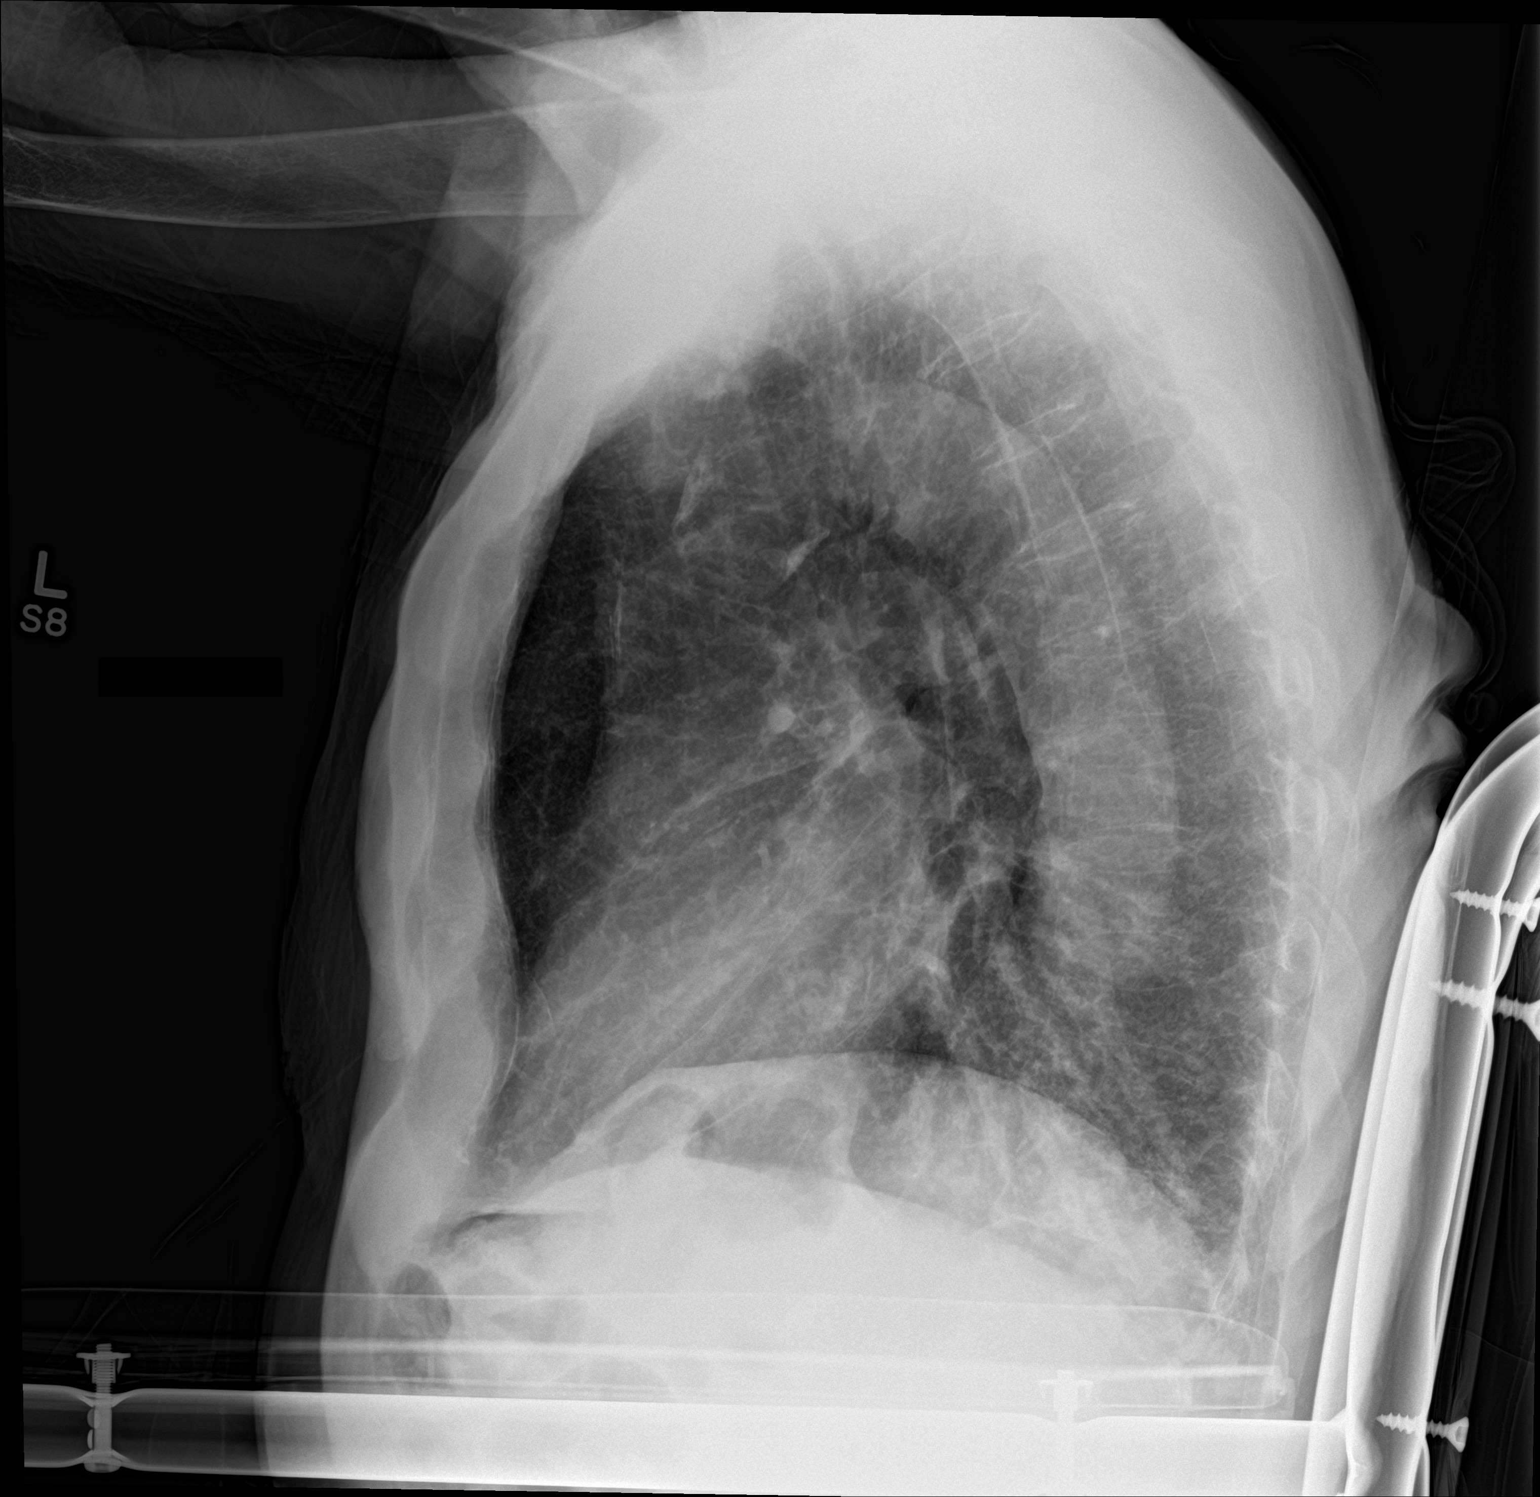

[chest ap]
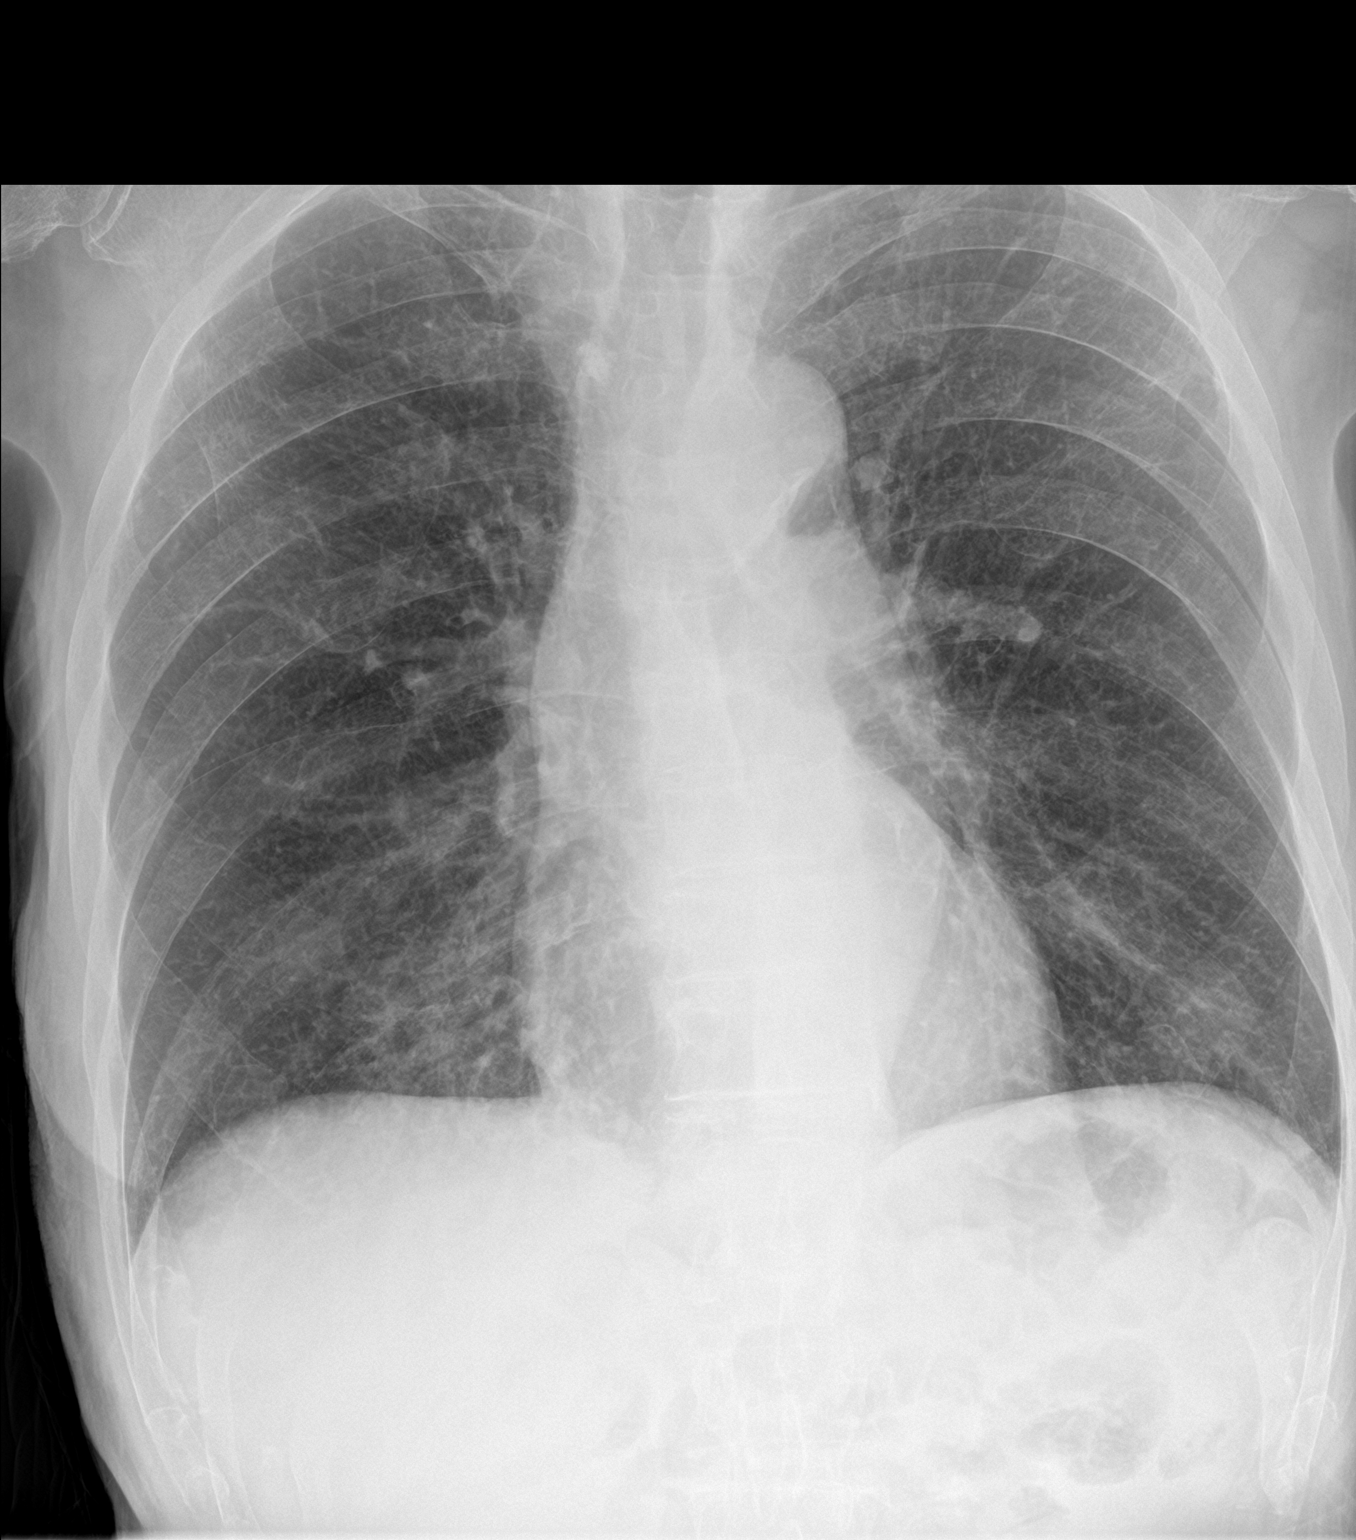

[2 of 2 positions shown; findings below may reference images not displayed]

FINDINGS: Mediastinum and hilar structures normal. Mild left lower lobe
infiltrate consistent pneumonia. No pleural effusion or
pneumothorax. Mild cardiomegaly. No evidence of overt congestive
heart failure.
IMPRESSION: Mild left lower lobe infiltrate consistent pneumonia.
